# Patient Record
Sex: Female | Born: 1955 | Race: White | Hispanic: No | Smoking: Current every day smoker
Health system: Southern US, Community
[De-identification: ages and names within clinical notes are randomized; demographics above are authoritative.]

## PROBLEM LIST (undated history)

## (undated) DIAGNOSIS — E041 Nontoxic single thyroid nodule: Secondary | ICD-10-CM

## (undated) DIAGNOSIS — D649 Anemia, unspecified: Secondary | ICD-10-CM

## (undated) DIAGNOSIS — J449 Chronic obstructive pulmonary disease, unspecified: Secondary | ICD-10-CM

## (undated) DIAGNOSIS — F17200 Nicotine dependence, unspecified, uncomplicated: Secondary | ICD-10-CM

## (undated) DIAGNOSIS — J189 Pneumonia, unspecified organism: Secondary | ICD-10-CM

## (undated) DIAGNOSIS — R0602 Shortness of breath: Secondary | ICD-10-CM

## (undated) DIAGNOSIS — F418 Other specified anxiety disorders: Secondary | ICD-10-CM

## (undated) DIAGNOSIS — J948 Other specified pleural conditions: Secondary | ICD-10-CM

## (undated) DIAGNOSIS — J90 Pleural effusion, not elsewhere classified: Secondary | ICD-10-CM

## (undated) DIAGNOSIS — R042 Hemoptysis: Secondary | ICD-10-CM

## (undated) DIAGNOSIS — J869 Pyothorax without fistula: Secondary | ICD-10-CM

## (undated) DIAGNOSIS — K219 Gastro-esophageal reflux disease without esophagitis: Secondary | ICD-10-CM

## (undated) DIAGNOSIS — R634 Abnormal weight loss: Secondary | ICD-10-CM

## (undated) DIAGNOSIS — Z8709 Personal history of other diseases of the respiratory system: Secondary | ICD-10-CM

## (undated) DIAGNOSIS — C3490 Malignant neoplasm of unspecified part of unspecified bronchus or lung: Secondary | ICD-10-CM

## (undated) DIAGNOSIS — J3800 Paralysis of vocal cords and larynx, unspecified: Secondary | ICD-10-CM

## (undated) HISTORY — PX: FOREARM FRACTURE SURGERY: SHX649

## (undated) HISTORY — DX: Abnormal weight loss: R63.4

## (undated) HISTORY — DX: Other specified anxiety disorders: F41.8

## (undated) HISTORY — DX: Pyothorax without fistula: J86.9

## (undated) HISTORY — DX: Other specified pleural conditions: J94.8

## (undated) HISTORY — DX: Chronic obstructive pulmonary disease, unspecified: J44.9

## (undated) HISTORY — DX: Hemoptysis: R04.2

## (undated) HISTORY — DX: Paralysis of vocal cords and larynx, unspecified: J38.00

## (undated) HISTORY — PX: PERIPHERALLY INSERTED CENTRAL CATHETER INSERTION: SHX2221

## (undated) HISTORY — DX: Pleural effusion, not elsewhere classified: J90

## (undated) HISTORY — DX: Anemia, unspecified: D64.9

## (undated) HISTORY — DX: Malignant neoplasm of unspecified part of unspecified bronchus or lung: C34.90

## (undated) HISTORY — DX: Nicotine dependence, unspecified, uncomplicated: F17.200

## (undated) HISTORY — DX: Nontoxic single thyroid nodule: E04.1

## (undated) HISTORY — DX: Pneumonia, unspecified organism: J18.9

## (undated) HISTORY — PX: THORACOTOMY: SUR1349

## (undated) HISTORY — DX: Gastro-esophageal reflux disease without esophagitis: K21.9

## (undated) HISTORY — DX: Personal history of other diseases of the respiratory system: Z87.09

---

## 1977-05-16 HISTORY — PX: EYE SURGERY: SHX253

## 1999-05-17 HISTORY — PX: LUNG LOBECTOMY: SHX167

## 2012-04-09 ENCOUNTER — Other Ambulatory Visit: Payer: Self-pay | Admitting: Internal Medicine

## 2012-04-09 DIAGNOSIS — C3490 Malignant neoplasm of unspecified part of unspecified bronchus or lung: Secondary | ICD-10-CM

## 2012-04-19 ENCOUNTER — Encounter: Payer: Self-pay | Admitting: *Deleted

## 2012-04-19 ENCOUNTER — Institutional Professional Consult (permissible substitution) (INDEPENDENT_AMBULATORY_CARE_PROVIDER_SITE_OTHER): Payer: Medicaid Other | Admitting: Cardiothoracic Surgery

## 2012-04-19 ENCOUNTER — Other Ambulatory Visit: Payer: Self-pay | Admitting: *Deleted

## 2012-04-19 ENCOUNTER — Other Ambulatory Visit: Payer: Self-pay | Admitting: Internal Medicine

## 2012-04-19 ENCOUNTER — Encounter (HOSPITAL_COMMUNITY)
Admission: RE | Admit: 2012-04-19 | Discharge: 2012-04-19 | Disposition: A | Discharge status: Home / Self Care | Payer: Medicaid Other | Source: Ambulatory Visit | Attending: Internal Medicine | Admitting: Internal Medicine

## 2012-04-19 VITALS — BP 120/80 | HR 88 | Temp 98.0°F | Resp 20 | Ht 65.0 in | Wt 94.0 lb

## 2012-04-19 DIAGNOSIS — K219 Gastro-esophageal reflux disease without esophagitis: Secondary | ICD-10-CM | POA: Insufficient documentation

## 2012-04-19 DIAGNOSIS — J449 Chronic obstructive pulmonary disease, unspecified: Secondary | ICD-10-CM | POA: Insufficient documentation

## 2012-04-19 DIAGNOSIS — C3490 Malignant neoplasm of unspecified part of unspecified bronchus or lung: Secondary | ICD-10-CM

## 2012-04-19 DIAGNOSIS — J3800 Paralysis of vocal cords and larynx, unspecified: Secondary | ICD-10-CM | POA: Insufficient documentation

## 2012-04-19 DIAGNOSIS — F17200 Nicotine dependence, unspecified, uncomplicated: Secondary | ICD-10-CM | POA: Insufficient documentation

## 2012-04-19 DIAGNOSIS — R222 Localized swelling, mass and lump, trunk: Secondary | ICD-10-CM

## 2012-04-19 DIAGNOSIS — R599 Enlarged lymph nodes, unspecified: Secondary | ICD-10-CM | POA: Insufficient documentation

## 2012-04-19 DIAGNOSIS — Z85118 Personal history of other malignant neoplasm of bronchus and lung: Secondary | ICD-10-CM

## 2012-04-19 DIAGNOSIS — Z8709 Personal history of other diseases of the respiratory system: Secondary | ICD-10-CM | POA: Insufficient documentation

## 2012-04-19 DIAGNOSIS — D381 Neoplasm of uncertain behavior of trachea, bronchus and lung: Secondary | ICD-10-CM

## 2012-04-19 DIAGNOSIS — R634 Abnormal weight loss: Secondary | ICD-10-CM | POA: Insufficient documentation

## 2012-04-19 DIAGNOSIS — F418 Other specified anxiety disorders: Secondary | ICD-10-CM | POA: Insufficient documentation

## 2012-04-19 LAB — GLUCOSE, CAPILLARY: Glucose-Capillary: 89 mg/dL (ref 70–99)

## 2012-04-19 MED ORDER — FLUDEOXYGLUCOSE F - 18 (FDG) INJECTION
14.1000 | Freq: Once | INTRAVENOUS | Status: AC | PRN
  Administered 2012-04-19: 14.1 via INTRAVENOUS

## 2012-04-19 NOTE — Progress Notes (Signed)
Spoke with pt and husband at [facility] today.  Educational/resource information given on smoking cessation

## 2012-04-23 ENCOUNTER — Ambulatory Visit (HOSPITAL_COMMUNITY)
Admission: RE | Admit: 2012-04-23 | Discharge: 2012-04-23 | Disposition: A | Discharge status: Home / Self Care | Payer: Medicaid Other | Source: Ambulatory Visit | Attending: Cardiothoracic Surgery | Admitting: Cardiothoracic Surgery

## 2012-04-23 DIAGNOSIS — Z85118 Personal history of other malignant neoplasm of bronchus and lung: Secondary | ICD-10-CM | POA: Insufficient documentation

## 2012-04-23 DIAGNOSIS — R0609 Other forms of dyspnea: Secondary | ICD-10-CM | POA: Insufficient documentation

## 2012-04-23 DIAGNOSIS — R222 Localized swelling, mass and lump, trunk: Secondary | ICD-10-CM | POA: Insufficient documentation

## 2012-04-23 DIAGNOSIS — R059 Cough, unspecified: Secondary | ICD-10-CM | POA: Insufficient documentation

## 2012-04-23 DIAGNOSIS — R05 Cough: Secondary | ICD-10-CM | POA: Insufficient documentation

## 2012-04-23 DIAGNOSIS — F17200 Nicotine dependence, unspecified, uncomplicated: Secondary | ICD-10-CM | POA: Insufficient documentation

## 2012-04-23 DIAGNOSIS — D381 Neoplasm of uncertain behavior of trachea, bronchus and lung: Secondary | ICD-10-CM

## 2012-04-23 DIAGNOSIS — R0989 Other specified symptoms and signs involving the circulatory and respiratory systems: Secondary | ICD-10-CM | POA: Insufficient documentation

## 2012-04-23 DIAGNOSIS — J988 Other specified respiratory disorders: Secondary | ICD-10-CM | POA: Insufficient documentation

## 2012-04-23 LAB — PULMONARY FUNCTION TEST

## 2012-04-23 MED ORDER — ALBUTEROL SULFATE (5 MG/ML) 0.5% IN NEBU
2.5000 mg | INHALATION_SOLUTION | Freq: Once | RESPIRATORY_TRACT | Status: AC
  Administered 2012-04-23: 2.5 mg via RESPIRATORY_TRACT

## 2012-04-24 ENCOUNTER — Telehealth: Payer: Self-pay | Admitting: *Deleted

## 2012-04-24 NOTE — Telephone Encounter (Signed)
Called pt with phone number for free nicotine patches to quit smoking.

## 2012-04-26 ENCOUNTER — Encounter: Payer: Self-pay | Admitting: Cardiothoracic Surgery

## 2012-04-26 ENCOUNTER — Ambulatory Visit (INDEPENDENT_AMBULATORY_CARE_PROVIDER_SITE_OTHER): Payer: Medicaid Other | Admitting: Cardiothoracic Surgery

## 2012-04-26 VITALS — BP 112/77 | HR 79 | Resp 18 | Ht 65.0 in | Wt 94.0 lb

## 2012-04-26 DIAGNOSIS — R599 Enlarged lymph nodes, unspecified: Secondary | ICD-10-CM

## 2012-04-26 DIAGNOSIS — Z85118 Personal history of other malignant neoplasm of bronchus and lung: Secondary | ICD-10-CM

## 2012-04-26 DIAGNOSIS — D381 Neoplasm of uncertain behavior of trachea, bronchus and lung: Secondary | ICD-10-CM

## 2012-04-26 NOTE — Progress Notes (Signed)
[address]            [place] [address]          [phone]         Charvi Gammage [facility] Medical Record #[id number] Date of Birth: 04-29-1956  Referring: [name], MD Primary Care: [name], MD  Chief Complaint:    Chief Complaint  Patient presents with  . 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 For the past 6-7 months she's been treated in [facility] and [facility] for a right lung/chest process. 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 Over that time she's become progressively cachectic, unfortunately is still smoking. She is now referred for further opinion on treatment. It appears she was turned down for any surgical intervention in [facility].   Since seen last week, she feels better, less GI complaints on probiotic. Has decreased smoking but has continued.  Current Activity/ Functional Status: Patient is independent with mobility/ambulation, transfers, ADL's, IADL's.   Past Medical History  Diagnosis Date  . Depression with anxiety   . COPD (chronic obstructive pulmonary disease)   . Empyema   . GERD (gastroesophageal reflux disease)   . Lung cancer     RULobectomy 2001, stage I adenocarcinoma T2, N0, Mx    . H/O pulmonary fibrosis   . Tobacco use disorder   . Vocal cord paralysis   . Abnormal weight loss   . Pneumonia   . Pleural effusion   . Thyroid nodule     tiny on right   . Hydropneumothorax   . Normocytic anemia   . Hemoptysis     Past Surgical History  Procedure Date  . Lung lobectomy 2001    right upper lobe  . Thoracotomy     right side  . Right forearm surgery after trauma   . Eye surgery     No family history on  file.  History   Social History  . Marital Status: Divorced    Spouse Name: N/A    Number of Children: N/A  . Years of Education: N/A   Occupational History  . unemployed    Social History Main Topics  . Smoking status: Current Every Day Smoker  . Smokeless tobacco: Not on file  . Alcohol Use: No  . Drug Use: No  . Sexually Active: Not on file   Other Topics Concern  . Not on file   Social History Narrative    She lives alone, she is divorced,     History  Smoking status  . Current Every Day Smoker  Smokeless tobacco  . Not on file    History  Alcohol Use No     Allergies  Allergen Reactions  . Nsaids Anaphylaxis  . Azithromycin Nausea And Vomiting  . Codeine Other (See Comments)    Severe anxiety  . Levaquin (Levofloxacin In D5w) Other (See Comments)    unknown    Current Outpatient Prescriptions  Medication Sig Dispense Refill  . albuterol (PROVENTIL HFA;VENTOLIN HFA)  108 (90 BASE) MCG/ACT inhaler Inhale 2 puffs into the lungs every 6 (six) hours as needed.      . ALPRAZolam (XANAX) 1 MG tablet Take 1 mg by mouth 3 (three) times daily as needed.      . dicyclomine (BENTYL) 20 MG tablet Take 20 mg by mouth every 6 (six) hours.      . mirtazapine (REMERON) 15 MG tablet Take 15 mg by mouth at bedtime.      Marland Kitchen omeprazole (PRILOSEC) 40 MG capsule Take 40 mg by mouth daily.      . ondansetron (ZOFRAN) 4 MG tablet Take 4 mg by mouth every 8 (eight) hours as needed.           Review of Systems:     Cardiac Review of Systems: Y or N  Chest Pain [  n  ]  Resting SOB [ n  ] Exertional SOB  Cove.Etienne  ]  Orthopnea [ n ]   Pedal Edema [  n ]    Palpitations [ n ] Syncope  [n  ]   Presyncope [ n ]  General Review of Systems: [Y] = yes [  ]=no Constitional: recent weight change [ y]; anorexia [ y ]; fatigue [  y]; nausea Cove.Etienne ]; night sweats Cove.Etienne  ]; fever [ y ]; or chills Cove.Etienne  ];                                                                                                                                           Dental: poor dentition[ y ];   Eye : blurred vision [  ]; diplopia [   ]; vision changes [  ];  Amaurosis fugax[  ]; Resp: cough Cove.Etienne  ];  wheezing[ y ];  hemoptysis[ n ]; shortness of breath[ y ]; paroxysmal nocturnal dyspnea[ y]; dyspnea on exertion[  y]; or orthopnea[  ];  GI:  gallstones[  ], vomiting[y  ];  dysphagia[y  ]; melena[ n ];  hematochezia [  ]; heartburn[  ];   Hx of  Colonoscopy[n  ]; GU: kidney stones [  ]; hematuria[  ];   dysuria [  ];  nocturia[  ];  history of     obstruction [  ];             Skin: rash, swelling[  ];, hair loss[  ];  peripheral edema[  ];  or itching[  ]; Musculosketetal: myalgias[ y ];  joint swelling[  ];  joint erythema[  ];  joint pain[  ];  back pain[  ];  Heme/Lymph: bruising[  ];  bleeding[  ];  anemia[  ];  Neuro: TIA[  ];  headaches[  ];  stroke[ n ];  vertigo[  ];  seizures[  ];   paresthesias[  ];  difficulty walking[  ];  Psych:depression[y  ]; Kendell Bane  ];  Endocrine: diabetes[  ];  thyroid dysfunction[  ];  Immunizations: Flu [ n ]; Pneumococcal[n  ];  Other:  Physical Exam: BP 112/77  Pulse 79  Resp 18  Ht 5\' 5"  (1.651 m)  Wt 94 lb (42.638 kg)  BMI 15.64 kg/m2  SpO2 91%  General appearance: alert, cooperative, appears older than stated age, cachectic, fatigued and mild distress Neurologic: intact Heart: regular rate and rhythm, S1, S2 normal, no murmur, click, rub or gallop and normal apical impulse Lungs: bronchophony LLL and LUL and diminished breath sounds RLL, RML and RUL Abdomen: soft, non-tender; bowel sounds normal; no masses,  no organomegaly Extremities: extremities normal, atraumatic, no cyanosis or edema and Homans sign is negative, no sign of DVT Wound: healed rt chest scar from thoracotomy No palpable cervical or supraclavicular adenopathy  Diagnostic Studies & Laboratory data:     Recent Radiology Findings:  Nm Pet Image Restag (ps) Skull Base To Thigh  04/19/2012   *RADIOLOGY REPORT*  Clinical Data: Subsequent treatment strategy for restaging of lung cancer.  NUCLEAR MEDICINE PET SKULL BASE TO THIGH  Fasting Blood Glucose:  89  Technique:  14.1 mCi F-18 FDG was injected intravenously. CT data was obtained and used for attenuation correction and anatomic localization only.  (This was not acquired as a diagnostic CT examination.) Additional exam technical data entered on technologist worksheet.  Comparison:  Chest CT of 07/14/2011.  Findings:  Neck: Motion degradation between the PET and CT images.  A focus of hypermetabolism which is felt to correspond to an interpolar right-sided thyroid nodule.  This measures 1.0 cm and a S.U.V. max of 4.2 on image 57/series 2.  Chest:  Although  suboptimally evaluated on these non dedicated axial only CT images, a cavitary process involving the superior right hemithorax is favored to represent a loculated hydropneumothorax, secondary to bronchopleural fistula.  There is nonspecific circumferential hypermetabolism within the right pleural space.  Decreased aerationwithin the inferior right lung, with diffuse pulmonary opacification and foci of extra alveolar air.  This area is relatively diffusely hypermetabolic, and measures a S.U.V. max of 11.3, including on image 105/series 2.  A more inferior area of right-sided pleural based hypermetabolism is at the site of pleural fluid or thickening.  This measures a S.U.V. max of 8.4 on approximately image 118/series 2.  A right paratracheal node measures 1.6 cm and a S.U.V. max of 5.5 on image 79/series 2.  This node is decreased from 2.3 cm on the prior exam.  Separate area of apparent right paratracheal hypermetabolism is secondary to Port-A-Cath injection site.  Abdomen/Pelvis:  Eccentric right posterior pelvic focus of hypermetabolism measures a S.U.V. max of 10.9, including on approximately image 215/series 2.  This projects posterior to the bladder on CT.  No definite uterine mass is seen.   There is a suggestion of fluid density with thin septate involving the right hemi pelvis on image 214/series 2.  Skeleton:  Mild hypermetabolism involving the posterior lateral right ninth rib.  Evaluation of this area is motion degraded on CT. There is no definite underlying lesion identified.  CT  images performed for attenuation correction demonstrate no further findings within the neck. Volume loss in the right hemithorax.  Severe centrilobular emphysema.  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  Right-sided pleural fluid/thickening, small in volume.  Mild osteopenia.  Probable bone island the right acetabulum.  Postsurgical or traumatic defects of high right ribs. Probable remote right clavicular trauma as  well.  IMPRESSION:  1.  Complex appearance of the right hemithorax.  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  High circumferential pleural hypermetabolism is indeterminate and could be inflammatory.  A lower right pleural focus of hypermetabolism is somewhat more focal and pleural metastatic disease cannot be excluded. Dedicated repeat contrast enhanced CT may be informative. 2.  Decreased aeration of the remaining right lung.  Near complete pulmonary opacification with areas of necrosis / extra alveolar air and hypermetabolism.  Findings all could be infectious or related to prior radiation therapy.  Underlying neoplastic process is difficult to exclude. 3.  Suspicious right paratracheal hypermetabolic node; decreased in size since 07/14/2011. 4.  No extra osseous extrathoracic disease identified. 5.  T9 focus of hypermetabolism which is not well evaluated at CT. Favored to be post-traumatic.  Recommend attention on follow-up. 6.  Hypermetabolic right thyroid nodule.  Consider further characterization with ultrasound. 7.  Hypermetabolism in the right paracentral pelvis.   Concurrent suggestion of a cystic process in this region.  Potential etiologies include hypermetabolism from a right hydrosalpinx or ovarian lesion versus an otherwise occult uterine process. Alternatively, this hypermetabolism could be physiologic and due to misregistration of the adjacent bladder.  Pelvic ultrasound should be considered.   Original Report Authenticated By: Jeronimo Greaves, M.D.     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 The CT scans in August compared to nail suggest complete destruction of the remaining lung. The patient is having difficulty taking any nutrition. She continues on IV antibiotics. I reviewed a significant number of records taking more than 2 hours and have discussed on the phone the case with Dr. Oneida Alar from [facility] had previously been following her. It appears she needs a completion pneumonectomy, and with the infected pleural space and L. lower cervical flap with chronic drainage of the right chest cavity.  I have recommend to her we proceed with bronchoscopy as first evaluation of developing a treatment plan she can survive. She is agreeable  Delight Ovens MD  Beeper [phone] Office [phone]

## 2012-04-26 NOTE — Patient Instructions (Signed)
No smoking Flexible Bronchoscopy Bronchoscopy is a procedure for diagnosing problems in the lungs. It allows your caregiver to examine the passageways in the lungs by direct exam. This is accomplished by the use of a flexible telescope-like tool (flexible fiberoptic bronchoscope), which is passed down to the air passageways to be examined.  LET YOUR CAREGIVER KNOW ABOUT:   Allergies to food or medicine.  Medicines taken, including vitamins, herbs, eyedrops, over-the-counter medicines, and creams.  Use of steroids (by mouth or creams).  Previous problems with anesthetics or numbing medicines.  History of bleeding problems or blood clots.  Previous surgery.  Other health problems, including diabetes and kidney problems.  Possibility of pregnancy, if this applies. BEFORE THE PROCEDURE   Do not eat or drink anything 8 hours before the test.  Medications may be given to relax you, dry up your secretions, and to control coughing. The medication which dries up secretions will make your mouth very dry.  Local anesthetics (medications) are given to numb your mouth, nose, throat and voice box (larynx). PROCEDURE   Relax as much as possible during the procedure.  Follow the caregiver's instructions to speed up the procedure.  Your breathing is not blocked (obstructed) during this procedure. You will be able to breath normally during the procedure.  If tissue samples (biopsies) are needed in the outer portions of the lung, sometimes a special type of X-ray (fluoroscopy) is required.  Abnormal areas will be brushed or biopsied for exam under a microscope.  If any bleeding occurs from these areas, a drug may be used to make the blood vessels smaller (constrict) to stop or decrease the bleeding.  You may receive a chest X-ray following the procedure to make sure the lungs have not collapsed (pneumothorax). AFTER THE PROCEDURE   You may resume normal activities.  Call the caregiver or  return for an appointment as directed if biopsies were taken.  Do not eat or drink anything until cough and gag reflexes have returned. There is danger of burning yourself or getting food or water into your lungs when your mouth and airways are numb. After the numbness is gone, you may begin taking a normal diet. SEEK IMMEDIATE MEDICAL CARE IF:   You become lightheaded.  You get short of breath.  You become faint.  You develop chest pain.  You cough up blood. Document Released: 04/29/2000 Document Revised: 07/25/2011 Document Reviewed: 01/19/2011 [facility] Patient Information 2013 [place], [place].  Flexible Bronchoscopy Care After These instructions give you information on caring for yourself after your procedure. Your doctor may also give you specific instructions. Call your doctor if you have any problems or questions after your procedure. HOME CARE  Do not eat or drink anything for 2 hours after your test.  After 2 hours have passed, eat soft food and drink liquids slowly.  The day after the test, go back to eating as normal.  Continue normal activities.  Keep all doctor visits if tissue samples (biopsies) were taken. Finding out the results of your test Ask when your test results will be ready. Make sure you get your test results. GET HELP RIGHT AWAY IF:  You get lightheaded.  You get short of breath.  You feel like you are going to pass out (faint).  You have chest pain.  You cough up blood. MAKE SURE YOU:  Understand these instructions.  Will watch your condition.  Will get help right away if you are not doing well or get worse. Document Released:  02/27/2009 Document Revised: 07/25/2011 Document Reviewed: 02/27/2009 [facility] Patient Information 2013 [place], [place].

## 2012-04-26 NOTE — Progress Notes (Signed)
[address]            [place] [address]          [phone]      [name] [name] [facility] Medical Record #[id number] Date of Birth: July 10, 1955  Referring: [name], MD Primary Care: [name], MD  Chief Complaint:    Chief Complaint  Patient presents with  . Adenopathy    MTOC/ Referral from Dr Reche Dixon for surgical eval on enlarged lymph nodes with a Hx of lung cancer, PET Scan 04/19/12    History of Present Illness:    Patient is a 56 year old female who underwent right upper lobectomy for carcinoma the lung approximately 12 years ago at another institution. For the past 6-7 months she's been treated in [facility] and [facility] for a right lung/chest process. 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 Over that time she's become progressively cachectic, unfortunately is still smoking. She is now referred for further opinion on treatment. It appears she was turned down for any surgical intervention in [facility].     Current Activity/ Functional Status: Patient is independent with mobility/ambulation, transfers, ADL's, IADL's.   Past Medical History  Diagnosis Date  . Depression with anxiety   . COPD (chronic obstructive pulmonary disease)   . Empyema   . GERD (gastroesophageal reflux disease)   . Lung cancer     RULobectomy 2001, stage I adenocarcinoma T2, N0, Mx    . H/O pulmonary fibrosis   . Tobacco use disorder   . Vocal cord paralysis   . Abnormal weight loss   . Pneumonia   . Pleural effusion   . Thyroid nodule     tiny on right   . Hydropneumothorax   . Normocytic anemia   . Hemoptysis     Past Surgical History  Procedure Date  . Lung lobectomy 2001    right upper lobe  . Thoracotomy     right side  . Right forearm surgery after trauma   . Eye surgery     No family history on file.  History   Social History  .  Marital Status: Divorced    Spouse Name: N/A    Number of Children: N/A  . Years of Education: N/A   Occupational History  . unemployed    Social History Main Topics  . Smoking status: Current Every Day Smoker  . Smokeless tobacco: Not on file  . Alcohol Use: No  . Drug Use: No  . Sexually Active: Not on file   Other Topics Concern  . Not on file   Social History Narrative    She lives alone, she is divorced,     History  Smoking status  . Current Every Day Smoker  Smokeless tobacco  . Not on file    History  Alcohol Use No     Allergies  Allergen Reactions  . Nsaids Anaphylaxis  . Azithromycin Nausea And Vomiting  . Codeine Other (See Comments)    Severe anxiety  . Levaquin (Levofloxacin In D5w) Other (See Comments)    unknown    Current Outpatient Prescriptions  Medication Sig Dispense Refill  . albuterol (PROVENTIL HFA;VENTOLIN HFA) 108 (90 BASE) MCG/ACT inhaler Inhale 2 puffs into the lungs every 6 (six) hours as needed.      Marland Kitchen  ALPRAZolam (XANAX) 1 MG tablet Take 1 mg by mouth 3 (three) times daily as needed.      . dicyclomine (BENTYL) 20 MG tablet Take 20 mg by mouth every 6 (six) hours.      . mirtazapine (REMERON) 15 MG tablet Take 15 mg by mouth at bedtime.      Marland Kitchen omeprazole (PRILOSEC) 40 MG capsule Take 40 mg by mouth daily.      . ondansetron (ZOFRAN) 4 MG tablet Take 4 mg by mouth every 8 (eight) hours as needed.           Review of Systems:     Cardiac Review of Systems: Y or N  Chest Pain [  n  ]  Resting SOB [ n  ] Exertional SOB  Cove.Etienne  ]  Orthopnea [ n ]   Pedal Edema [  n ]    Palpitations [ n ] Syncope  [n  ]   Presyncope [ n ]  General Review of Systems: [Y] = yes [  ]=no Constitional: recent weight change [ y]; anorexia [ y ]; fatigue [  y]; nausea Cove.Etienne ]; night sweats Cove.Etienne  ]; fever [ y ]; or chills Cove.Etienne  ];                                                                                                                                            Dental: poor dentition[ y ];   Eye : blurred vision [  ]; diplopia [   ]; vision changes [  ];  Amaurosis fugax[  ]; Resp: cough Cove.Etienne  ];  wheezing[ y ];  hemoptysis[ n ]; shortness of breath[ y ]; paroxysmal nocturnal dyspnea[ y]; dyspnea on exertion[  y]; or orthopnea[  ];  GI:  gallstones[  ], vomiting[y  ];  dysphagia[y  ]; melena[ n ];  hematochezia [  ]; heartburn[  ];   Hx of  Colonoscopy[n  ]; GU: kidney stones [  ]; hematuria[  ];   dysuria [  ];  nocturia[  ];  history of     obstruction [  ];             Skin: rash, swelling[  ];, hair loss[  ];  peripheral edema[  ];  or itching[  ]; Musculosketetal: myalgias[ y ];  joint swelling[  ];  joint erythema[  ];  joint pain[  ];  back pain[  ];  Heme/Lymph: bruising[  ];  bleeding[  ];  anemia[  ];  Neuro: TIA[  ];  headaches[  ];  stroke[ n ];  vertigo[  ];  seizures[  ];   paresthesias[  ];  difficulty walking[  ];  Psych:depression[y  ]; Kendell Bane  ];  Endocrine: diabetes[  ];  thyroid dysfunction[  ];  Immunizations: Flu [ n ]; Pneumococcal[n  ];  Other:  Physical Exam: BP 120/80  Pulse 88  Temp 98 F (36.7 C) (Oral)  Resp 20  Ht 5\' 5"  (1.651 m)  Wt 94 lb (42.638 kg)  BMI 15.64 kg/m2  SpO2 90%  General appearance: alert, cooperative, appears older than stated age, cachectic, fatigued and mild distress Neurologic: intact Heart: regular rate and rhythm, S1, S2 normal, no murmur, click, rub or gallop and normal apical impulse Lungs: bronchophony LLL and LUL and diminished breath sounds RLL, RML and RUL Abdomen: soft, non-tender; bowel sounds normal; no masses,  no organomegaly Extremities: extremities normal, atraumatic, no cyanosis or edema and Homans sign is negative, no sign of DVT Wound: healed rt chest scar from thoracotomy No palpable cervical or supraclavicular adenopathy  Diagnostic Studies & Laboratory data:     Recent Radiology Findings:  Nm Pet Image Restag (ps) Skull Base To Thigh  04/19/2012  *RADIOLOGY  REPORT*  Clinical Data: Subsequent treatment strategy for restaging of lung cancer.  NUCLEAR MEDICINE PET SKULL BASE TO THIGH  Fasting Blood Glucose:  89  Technique:  14.1 mCi F-18 FDG was injected intravenously. CT data was obtained and used for attenuation correction and anatomic localization only.  (This was not acquired as a diagnostic CT examination.) Additional exam technical data entered on technologist worksheet.  Comparison:  Chest CT of 07/14/2011.  Findings:  Neck: Motion degradation between the PET and CT images.  A focus of hypermetabolism which is felt to correspond to an interpolar right-sided thyroid nodule.  This measures 1.0 cm and a S.U.V. max of 4.2 on image 57/series 2.  Chest:  Although  suboptimally evaluated on these non dedicated axial only CT images, a cavitary process involving the superior right hemithorax is favored to represent a loculated hydropneumothorax, secondary to bronchopleural fistula.  There is nonspecific circumferential hypermetabolism within the right pleural space.  Decreased aerationwithin the inferior right lung, with diffuse pulmonary opacification and foci of extra alveolar air.  This area is relatively diffusely hypermetabolic, and measures a S.U.V. max of 11.3, including on image 105/series 2.  A more inferior area of right-sided pleural based hypermetabolism is at the site of pleural fluid or thickening.  This measures a S.U.V. max of 8.4 on approximately image 118/series 2.  A right paratracheal node measures 1.6 cm and a S.U.V. max of 5.5 on image 79/series 2.  This node is decreased from 2.3 cm on the prior exam.  Separate area of apparent right paratracheal hypermetabolism is secondary to Port-A-Cath injection site.  Abdomen/Pelvis:  Eccentric right posterior pelvic focus of hypermetabolism measures a S.U.V. max of 10.9, including on approximately image 215/series 2.  This projects posterior to the bladder on CT.  No definite uterine mass is seen.  There is a  suggestion of fluid density with thin septate involving the right hemi pelvis on image 214/series 2.  Skeleton:  Mild hypermetabolism involving the posterior lateral right ninth rib.  Evaluation of this area is motion degraded on CT. There is no definite underlying lesion identified.  CT  images performed for attenuation correction demonstrate no further findings within the neck. Volume loss in the right hemithorax.  Severe centrilobular emphysema.  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  Right-sided pleural fluid/thickening, small in volume.  Mild osteopenia.  Probable bone island the right acetabulum.  Postsurgical or traumatic defects of high right ribs. Probable remote right clavicular trauma as well.  IMPRESSION:  1.  Complex appearance of the right hemithorax.  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  High circumferential pleural hypermetabolism is indeterminate and could be inflammatory.  A lower right pleural focus of hypermetabolism is somewhat more focal and pleural metastatic disease cannot be excluded. Dedicated repeat contrast enhanced CT may be informative. 2.  Decreased aeration of the remaining right lung.  Near complete pulmonary opacification with areas of necrosis / extra alveolar air and hypermetabolism.  Findings all could be infectious or related to prior radiation therapy.  Underlying neoplastic process is difficult to exclude. 3.  Suspicious right paratracheal hypermetabolic node; decreased in size since 07/14/2011. 4.  No extra osseous extrathoracic disease identified. 5.  T9 focus of hypermetabolism which is not well evaluated at CT. Favored to be post-traumatic.  Recommend attention on follow-up. 6.  Hypermetabolic right thyroid nodule.  Consider further characterization with ultrasound. 7.  Hypermetabolism in the right paracentral pelvis.  Concurrent  suggestion of a cystic process in this region.  Potential etiologies include hypermetabolism from a right hydrosalpinx or ovarian lesion versus an otherwise occult uterine process. Alternatively, this hypermetabolism could be physiologic and due to misregistration of the adjacent bladder.  Pelvic ultrasound should be considered.   Original Report Authenticated By: Jeronimo Greaves, M.D.     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 The CT scans in August compared to nail suggest complete destruction of the remaining lung. The patient is having difficulty taking any nutrition. She continues on IV antibiotics. I reviewed a significant number of records taking more than 2 hours and have discussed on the phone the case with Dr. Oneida Alar from [facility] had previously been following her. It appears she needs a completion pneumonectomy, and with the infected pleural space and L. lower cervical flap with chronic drainage of the right chest cavity. I plan to see the patient back in one week strongly encouraged her to stop smoking, will obtain a new set of pulmonary function studies, and further review her extensive medical record. With the patient's long-term antibiotics and bowel complaints we've suggested to her using a probiotic over-the-counter, to absolutely restrain from smoking, and have made some arrangements for her to obtain nicotine patches to help in this endeavor.  I spent over an hour face-to-face time with the patient and her husband and additional 2 hours and reveal her extensive medical records from [facility] and from [place].  We'll plan to see her back next week with followup pulmonary function studies ,  and likely will recommend bronchoscopy before making any further definite treatment plans . Delight Ovens MD  Beeper [phone] Office [phone]

## 2012-04-27 ENCOUNTER — Other Ambulatory Visit: Payer: Self-pay | Admitting: *Deleted

## 2012-04-27 ENCOUNTER — Encounter (HOSPITAL_COMMUNITY): Payer: Self-pay | Admitting: Pharmacy Technician

## 2012-04-27 DIAGNOSIS — R911 Solitary pulmonary nodule: Secondary | ICD-10-CM

## 2012-04-30 ENCOUNTER — Inpatient Hospital Stay (HOSPITAL_COMMUNITY): Admission: RE | Admit: 2012-04-30 | Discharge: 2012-04-30 | Payer: Medicaid Other | Source: Ambulatory Visit

## 2012-04-30 ENCOUNTER — Encounter (HOSPITAL_COMMUNITY): Payer: Self-pay | Admitting: *Deleted

## 2012-04-30 NOTE — Pre-Procedure Instructions (Signed)
20 [name] [name]  04/30/2012   Your procedure is scheduled on:  Tuesday, December 17th.  Report to [facility] at 7:30AM.  Call this number if you have problems the morning of surgery: [phone]   Remember: Nothing to eat or drink after Midnight.     Take these medicines the morning of surgery with A SIP OF WATER:  Omeprazole (Prilosec) , use Albuterol Inhaler and bring to the hospital with you.  May take Alprazolam (Xanax) if needed.   Do not wear jewelry, make-up or nail polish.  Do not wear lotions, powders, or perfumes. You may wear deodorant.  Do not shave 48 hours prior to surgery. Men may shave face and neck.  Do not bring valuables to the hospital.  Contacts, dentures or bridgework may not be worn into surgery.  Leave suitcase in the car. After surgery it may be brought to your room.  For patients admitted to the hospital, checkout time is 11:00 AM the day of discharge.   Patients discharged the day of surgery will not be allowed to drive home.  Name and phone number of your driver: __________________   Special Instructions: Shower with CHG wash (Bactoshield) tonight and again in the am prior to arriving to hospital.   Please read over the following fact sheets that you were given: Pain Booklet, Coughing and Deep Breathing and Surgical Site Infection Prevention

## 2012-04-30 NOTE — Progress Notes (Signed)
I notified Darius Bump with [facility] that Mrs Lerner could not come in for PAT.  Alycia Rossetti said she would Notify Dr Tyrone Sage. Pt to arrive at 0730 to [facility] tomorrow.

## 2012-04-30 NOTE — Progress Notes (Signed)
Pt did not come for PAT.  I called patients home and patient asked me to talk to her husband.  Mr Garza said that paat was coming for PAT visit and portable oxygen tank would not fill.  Mr Burger said that the had been on th e phone with  [facility]. [facility] said patient had to come to the store to get another machine, pt is unable to go there without oxygen.  I instructed patients husband to call her medical md.  I will call patient back later.

## 2012-05-01 ENCOUNTER — Ambulatory Visit (HOSPITAL_COMMUNITY): Payer: Medicaid Other | Admitting: Anesthesiology

## 2012-05-01 ENCOUNTER — Encounter (HOSPITAL_COMMUNITY): Payer: Self-pay | Admitting: *Deleted

## 2012-05-01 ENCOUNTER — Encounter (HOSPITAL_COMMUNITY)
Admission: RE | Discharge status: Against Medical Advice | Payer: Self-pay | Source: Ambulatory Visit | Attending: Cardiothoracic Surgery

## 2012-05-01 ENCOUNTER — Encounter (HOSPITAL_COMMUNITY): Payer: Self-pay | Admitting: Anesthesiology

## 2012-05-01 ENCOUNTER — Inpatient Hospital Stay (HOSPITAL_COMMUNITY)
Admission: RE | Admit: 2012-05-01 | Discharge: 2012-05-02 | DRG: 167 | Discharge status: Against Medical Advice | Payer: Medicaid Other | Source: Ambulatory Visit | Attending: Cardiothoracic Surgery | Admitting: Cardiothoracic Surgery

## 2012-05-01 ENCOUNTER — Emergency Department (HOSPITAL_COMMUNITY)
Admission: EM | Admit: 2012-05-01 | Discharge: 2012-05-01 | Disposition: A | Discharge status: Other Acute Inpatient Hospital | Payer: Medicaid Other

## 2012-05-01 ENCOUNTER — Ambulatory Visit (HOSPITAL_COMMUNITY): Payer: Medicaid Other

## 2012-05-01 DIAGNOSIS — F17200 Nicotine dependence, unspecified, uncomplicated: Secondary | ICD-10-CM | POA: Diagnosis present

## 2012-05-01 DIAGNOSIS — R634 Abnormal weight loss: Secondary | ICD-10-CM | POA: Diagnosis present

## 2012-05-01 DIAGNOSIS — R911 Solitary pulmonary nodule: Secondary | ICD-10-CM

## 2012-05-01 DIAGNOSIS — F341 Dysthymic disorder: Secondary | ICD-10-CM | POA: Diagnosis present

## 2012-05-01 DIAGNOSIS — J439 Emphysema, unspecified: Secondary | ICD-10-CM

## 2012-05-01 DIAGNOSIS — K219 Gastro-esophageal reflux disease without esophagitis: Secondary | ICD-10-CM | POA: Diagnosis present

## 2012-05-01 DIAGNOSIS — R64 Cachexia: Secondary | ICD-10-CM | POA: Diagnosis present

## 2012-05-01 DIAGNOSIS — J449 Chronic obstructive pulmonary disease, unspecified: Secondary | ICD-10-CM | POA: Diagnosis present

## 2012-05-01 DIAGNOSIS — Z681 Body mass index (BMI) 19 or less, adult: Secondary | ICD-10-CM

## 2012-05-01 DIAGNOSIS — F418 Other specified anxiety disorders: Secondary | ICD-10-CM | POA: Diagnosis present

## 2012-05-01 DIAGNOSIS — R627 Adult failure to thrive: Secondary | ICD-10-CM | POA: Diagnosis present

## 2012-05-01 DIAGNOSIS — J852 Abscess of lung without pneumonia: Principal | ICD-10-CM | POA: Diagnosis present

## 2012-05-01 DIAGNOSIS — J4489 Other specified chronic obstructive pulmonary disease: Secondary | ICD-10-CM | POA: Diagnosis present

## 2012-05-01 HISTORY — DX: Shortness of breath: R06.02

## 2012-05-01 HISTORY — PX: VIDEO BRONCHOSCOPY: SHX5072

## 2012-05-01 LAB — COMPREHENSIVE METABOLIC PANEL
ALT: 5 U/L (ref 0–35)
AST: 9 U/L (ref 0–37)
Albumin: 2.6 g/dL — ABNORMAL LOW (ref 3.5–5.2)
Alkaline Phosphatase: 83 U/L (ref 39–117)
BUN: 5 mg/dL — ABNORMAL LOW (ref 6–23)
CO2: 28 mEq/L (ref 19–32)
Calcium: 9.9 mg/dL (ref 8.4–10.5)
Chloride: 98 mEq/L (ref 96–112)
Creatinine, Ser: 0.45 mg/dL — ABNORMAL LOW (ref 0.50–1.10)
GFR calc Af Amer: >90 mL/min (ref >90)
GFR calc non Af Amer: >90 mL/min (ref >90)
Glucose, Bld: 95 mg/dL (ref 70–99)
Potassium: 4 mEq/L (ref 3.5–5.1)
Sodium: 136 mEq/L (ref 135–145)
Total Bilirubin: 0.2 mg/dL — ABNORMAL LOW (ref 0.3–1.2)
Total Protein: 8.8 g/dL — ABNORMAL HIGH (ref 6.0–8.3)

## 2012-05-01 LAB — CBC
HCT: 32.4 % — ABNORMAL LOW (ref 36.0–46.0)
Hemoglobin: 10.1 g/dL — ABNORMAL LOW (ref 12.0–15.0)
MCH: 28.6 pg (ref 26.0–34.0)
MCHC: 31.2 g/dL (ref 30.0–36.0)
MCV: 91.8 fL (ref 78.0–100.0)
Platelets: 376 10*3/uL (ref 150–400)
RBC: 3.53 MIL/uL — ABNORMAL LOW (ref 3.87–5.11)
RDW: 17.5 % — ABNORMAL HIGH (ref 11.5–15.5)
WBC: 11.5 10*3/uL — ABNORMAL HIGH (ref 4.0–10.5)

## 2012-05-01 LAB — PROTIME-INR
INR: 1.06 (ref 0.00–1.49)
Prothrombin Time: 13.7 seconds (ref 11.6–15.2)

## 2012-05-01 LAB — ALPHA-1-ANTITRYPSIN: A-1 Antitrypsin, Ser: 278 mg/dL — ABNORMAL HIGH (ref 90–200)

## 2012-05-01 LAB — APTT: aPTT: 33 seconds (ref 24–37)

## 2012-05-01 SURGERY — BRONCHOSCOPY, VIDEO-ASSISTED
Anesthesia: Monitor Anesthesia Care | Site: Chest | Wound class: Dirty or Infected

## 2012-05-01 MED ORDER — EPHEDRINE SULFATE 50 MG/ML IJ SOLN
INTRAMUSCULAR | Status: DC | PRN
  Administered 2012-05-01: 10 mg via INTRAVENOUS

## 2012-05-01 MED ORDER — MIDAZOLAM HCL 5 MG/5ML IJ SOLN
INTRAMUSCULAR | Status: DC | PRN
  Administered 2012-05-01: 2 mg via INTRAVENOUS

## 2012-05-01 MED ORDER — LACTATED RINGERS IV SOLN
INTRAVENOUS | Status: DC | PRN
  Administered 2012-05-01: 14:00:00 via INTRAVENOUS

## 2012-05-01 MED ORDER — PROPOFOL 10 MG/ML IV BOLUS
INTRAVENOUS | Status: DC | PRN
  Administered 2012-05-01: 100 mg via INTRAVENOUS
  Administered 2012-05-01 (×4): 10 mg via INTRAVENOUS

## 2012-05-01 MED ORDER — ALBUTEROL SULFATE (5 MG/ML) 0.5% IN NEBU
INHALATION_SOLUTION | RESPIRATORY_TRACT | Status: AC
  Administered 2012-05-01: 2.5 mg
  Filled 2012-05-01: qty 0.5

## 2012-05-01 MED ORDER — ONDANSETRON HCL 4 MG/2ML IJ SOLN
4.0000 mg | Freq: Once | INTRAMUSCULAR | Status: AC
  Administered 2012-05-01: 4 mg via INTRAVENOUS
  Filled 2012-05-01: qty 2

## 2012-05-01 MED ORDER — HYDROMORPHONE HCL PF 1 MG/ML IJ SOLN: 0.2500 mg | INTRAMUSCULAR | Status: DC | PRN

## 2012-05-01 MED ORDER — DEXTROSE-NACL 5-0.45 % IV SOLN
INTRAVENOUS | Status: DC
  Administered 2012-05-01: 17:00:00 via INTRAVENOUS

## 2012-05-01 MED ORDER — SENNA 8.6 MG PO TABS
1.0000 | ORAL_TABLET | Freq: Two times a day (BID) | ORAL | Status: DC
  Administered 2012-05-01: 8.6 mg via ORAL
  Filled 2012-05-01 (×3): qty 1

## 2012-05-01 MED ORDER — ONDANSETRON HCL 4 MG/2ML IJ SOLN: 4.0000 mg | Freq: Once | INTRAMUSCULAR | Status: DC | PRN

## 2012-05-01 MED ORDER — ALPRAZOLAM 1 MG PO TABS: 1.0000 mg | ORAL_TABLET | Freq: Three times a day (TID) | ORAL | Status: DC | PRN

## 2012-05-01 MED ORDER — NEOSTIGMINE METHYLSULFATE 1 MG/ML IJ SOLN
INTRAMUSCULAR | Status: DC | PRN
  Administered 2012-05-01: 3 mg via INTRAVENOUS

## 2012-05-01 MED ORDER — ACETAMINOPHEN 650 MG RE SUPP: 650.0000 mg | Freq: Four times a day (QID) | RECTAL | Status: DC | PRN

## 2012-05-01 MED ORDER — GLYCOPYRROLATE 0.2 MG/ML IJ SOLN
INTRAMUSCULAR | Status: DC | PRN
  Administered 2012-05-01: .5 mg via INTRAVENOUS

## 2012-05-01 MED ORDER — DOCUSATE SODIUM 100 MG PO CAPS
100.0000 mg | ORAL_CAPSULE | Freq: Two times a day (BID) | ORAL | Status: DC
  Administered 2012-05-01: 100 mg via ORAL
  Filled 2012-05-01: qty 1

## 2012-05-01 MED ORDER — VANCOMYCIN HCL IN DEXTROSE 1-5 GM/200ML-% IV SOLN
1000.0000 mg | INTRAVENOUS | Status: DC
  Filled 2012-05-01: qty 200

## 2012-05-01 MED ORDER — PANTOPRAZOLE SODIUM 40 MG PO TBEC
80.0000 mg | DELAYED_RELEASE_TABLET | Freq: Every day | ORAL | Status: DC
  Administered 2012-05-01: 80 mg via ORAL
  Filled 2012-05-01: qty 2

## 2012-05-01 MED ORDER — ONDANSETRON HCL 4 MG/2ML IJ SOLN
INTRAMUSCULAR | Status: AC
  Filled 2012-05-01: qty 2

## 2012-05-01 MED ORDER — ACETAMINOPHEN 325 MG PO TABS
650.0000 mg | ORAL_TABLET | Freq: Four times a day (QID) | ORAL | Status: DC | PRN
  Administered 2012-05-01: 650 mg via ORAL
  Filled 2012-05-01: qty 2

## 2012-05-01 MED ORDER — ROCURONIUM BROMIDE 100 MG/10ML IV SOLN
INTRAVENOUS | Status: DC | PRN
  Administered 2012-05-01: 30 mg via INTRAVENOUS

## 2012-05-01 MED ORDER — DIPHENHYDRAMINE HCL 25 MG PO CAPS
25.0000 mg | ORAL_CAPSULE | Freq: Every evening | ORAL | Status: DC | PRN
  Administered 2012-05-01: 25 mg via ORAL
  Filled 2012-05-01: qty 1

## 2012-05-01 MED ORDER — PHENYLEPHRINE HCL 10 MG/ML IJ SOLN
INTRAMUSCULAR | Status: DC | PRN
  Administered 2012-05-01: 80 ug via INTRAVENOUS
  Administered 2012-05-01: 160 ug via INTRAVENOUS
  Administered 2012-05-01 (×3): 80 ug via INTRAVENOUS

## 2012-05-01 MED ORDER — ACETAMINOPHEN 10 MG/ML IV SOLN: 1000.0000 mg | Freq: Once | INTRAVENOUS | Status: DC | PRN

## 2012-05-01 MED ORDER — LIDOCAINE HCL (PF) 1 % IJ SOLN
INTRAMUSCULAR | Status: AC
  Filled 2012-05-01: qty 30

## 2012-05-01 MED ORDER — ALPRAZOLAM 0.5 MG PO TABS
ORAL_TABLET | ORAL | Status: AC
  Filled 2012-05-01: qty 2

## 2012-05-01 MED ORDER — ALPRAZOLAM 0.5 MG PO TABS
1.0000 mg | ORAL_TABLET | Freq: Three times a day (TID) | ORAL | Status: DC | PRN
  Administered 2012-05-01 – 2012-05-02 (×2): 1 mg via ORAL
  Filled 2012-05-01: qty 2

## 2012-05-01 MED ORDER — ONDANSETRON HCL 4 MG/2ML IJ SOLN
INTRAMUSCULAR | Status: DC | PRN
  Administered 2012-05-01: 4 mg via INTRAVENOUS

## 2012-05-01 MED ORDER — ONDANSETRON HCL 4 MG PO TABS: 4.0000 mg | ORAL_TABLET | Freq: Four times a day (QID) | ORAL | Status: DC | PRN

## 2012-05-01 MED ORDER — ONDANSETRON HCL 4 MG/2ML IJ SOLN: 4.0000 mg | Freq: Four times a day (QID) | INTRAMUSCULAR | Status: DC | PRN

## 2012-05-01 MED ORDER — FENTANYL CITRATE 0.05 MG/ML IJ SOLN
INTRAMUSCULAR | Status: DC | PRN
  Administered 2012-05-01: 150 ug via INTRAVENOUS

## 2012-05-01 MED ORDER — LEVALBUTEROL HCL 0.63 MG/3ML IN NEBU
0.6300 mg | INHALATION_SOLUTION | Freq: Four times a day (QID) | RESPIRATORY_TRACT | Status: DC
  Filled 2012-05-01 (×7): qty 3

## 2012-05-01 MED ORDER — DICYCLOMINE HCL 20 MG PO TABS
20.0000 mg | ORAL_TABLET | Freq: Two times a day (BID) | ORAL | Status: DC
  Administered 2012-05-01: 20 mg via ORAL
  Filled 2012-05-01 (×3): qty 1

## 2012-05-01 MED ORDER — LACTATED RINGERS IV SOLN: INTRAVENOUS | Status: DC

## 2012-05-01 MED ORDER — SODIUM CHLORIDE 0.9 % IV SOLN
250.0000 mg | Freq: Three times a day (TID) | INTRAVENOUS | Status: DC
  Administered 2012-05-01 – 2012-05-02 (×2): 250 mg via INTRAVENOUS
  Filled 2012-05-01 (×4): qty 250

## 2012-05-01 SURGICAL SUPPLY — 19 items
BLOCK BITE 60FR ADLT L/F BLUE (MISCELLANEOUS) ×2 IMPLANT
BRUSH CYTOL CELLEBRITY 1.5X140 (MISCELLANEOUS) IMPLANT
CANISTER SUCTION 2500CC (MISCELLANEOUS) ×2 IMPLANT
CLOTH BEACON ORANGE TIMEOUT ST (SAFETY) ×2 IMPLANT
CONT SPEC 4OZ CLIKSEAL STRL BL (MISCELLANEOUS) ×2 IMPLANT
COTTONBALL LRG STERILE PKG (GAUZE/BANDAGES/DRESSINGS) ×2 IMPLANT
COVER TABLE BACK 60X90 (DRAPES) ×2 IMPLANT
FORCEPS BIOP RJ4 1.8 (CUTTING FORCEPS) IMPLANT
KIT ROOM TURNOVER OR (KITS) ×2 IMPLANT
MARKER SKIN DUAL TIP RULER LAB (MISCELLANEOUS) ×2 IMPLANT
NEEDLE BIOPSY TRANSBRONCH 21G (NEEDLE) IMPLANT
NS IRRIG 1000ML POUR BTL (IV SOLUTION) ×2 IMPLANT
OIL SILICONE PENTAX (PARTS (SERVICE/REPAIRS)) ×2 IMPLANT
SPONGE GAUZE 4X4 12PLY (GAUZE/BANDAGES/DRESSINGS) ×2 IMPLANT
SYR 20ML ECCENTRIC (SYRINGE) ×2 IMPLANT
TOWEL OR 17X24 6PK STRL BLUE (TOWEL DISPOSABLE) ×2 IMPLANT
TRAP SPECIMEN MUCOUS 40CC (MISCELLANEOUS) ×2 IMPLANT
TUBE CONNECTING 12X1/4 (SUCTIONS) ×2 IMPLANT
WATER STERILE IRR 1000ML POUR (IV SOLUTION) ×2 IMPLANT

## 2012-05-01 NOTE — Progress Notes (Signed)
ANTIBIOTIC CONSULT NOTE - INITIAL  Pharmacy Consult for vancomycin and primaxin Indication: suspected R lung abscess  Allergies  Allergen Reactions  . Codeine Other (See Comments)    Severe anxiety  . Other Other (See Comments)    Steroids  "feels like choking"    Patient Measurements: Height: 5\' 5"  (165.1 cm) Weight: 93 lb 7.6 oz (42.4 kg) IBW/kg (Calculated) : 57    Vital Signs: Temp: 98 F (36.7 C) (12/17 1755) Temp src: Oral (12/17 1755) BP: 108/71 mmHg (12/17 1755) Pulse Rate: 97  (12/17 1800) Intake/Output from previous day:   Intake/Output from this shift: Total I/O In: 1090 [P.O.:240; I.V.:850] Out: -   Labs:  Basename 05/01/12 0906  WBC 11.5*  HGB 10.1*  PLT 376  LABCREA --  CREATININE 0.45*   Estimated Creatinine Clearance: 53.8 ml/min (by C-G formula based on Cr of 0.45). No results found for this basename: VANCOTROUGH:2,VANCOPEAK:2,VANCORANDOM:2,GENTTROUGH:2,GENTPEAK:2,GENTRANDOM:2,TOBRATROUGH:2,TOBRAPEAK:2,TOBRARND:2,AMIKACINPEAK:2,AMIKACINTROU:2,AMIKACIN:2, in the last 72 hours   Microbiology: No results found for this or any previous visit (from the past 720 hour(s)).  Medical History: Past Medical History  Diagnosis Date  . Depression with anxiety   . COPD (chronic obstructive pulmonary disease)   . Empyema   . GERD (gastroesophageal reflux disease)   . Lung cancer     RULobectomy 2001, stage I adenocarcinoma T2, N0, Mx    . H/O pulmonary fibrosis   . Tobacco use disorder   . Vocal cord paralysis   . Abnormal weight loss   . Pneumonia   . Pleural effusion   . Thyroid nodule     tiny on right   . Hydropneumothorax   . Normocytic anemia   . Hemoptysis   . Shortness of breath     with exertion    Medications:  Prescriptions prior to admission  Medication Sig Dispense Refill  . albuterol (PROVENTIL HFA;VENTOLIN HFA) 108 (90 BASE) MCG/ACT inhaler Inhale 2 puffs into the lungs 2 (two) times daily as needed. For shortness of breath       . ALPRAZolam (XANAX) 1 MG tablet Take 1 mg by mouth 3 (three) times daily as needed. For anxiety      . dicyclomine (BENTYL) 20 MG tablet Take 20 mg by mouth 2 (two) times daily.       Marland Kitchen omeprazole (PRILOSEC) 40 MG capsule Take 40 mg by mouth 2 (two) times daily.        Assessment: 56 yo F s/p VATS for lung abscess 12/17.  S/p RUL lobectomy for Lung Ca ~12 years ago.  Noted to have been receiving abx via PICC over the last 6-7 months for R lung/chest process.  Noted to have FTT and advanced emyphysema, still actively smoking.  No a candidate for pneumonecty given advanced disease.  Spoke with May, PharmD at [facility] [phone].  She reports that patient receives Cefepime 2g IV q12h at home.  Patient states that she took her last dose yesterday morning. She didn't take the dose last night because, "it was going to give me a yeast infection."  Goal of Therapy:  Vancomycin trough level 15-20 mcg/ml  Plan:  - Vancomycin 1 g q24h - Primaxin 250 mg IV q8h - Follow up SCr, UOP, cultures, clinical course and adjust as clinically indicated.  Maronda Caison L. Illene Bolus, PharmD, BCPS Clinical Pharmacist Pager: [phone] Pharmacy: [phone] 05/01/2012 6:40 PM

## 2012-05-01 NOTE — Transfer of Care (Signed)
Immediate Anesthesia Transfer of Care Note  Patient: [name] [name]  Procedure(s) Performed: Procedure(s) (LRB) with comments: VIDEO BRONCHOSCOPY (N/A) - Video Bronchoscopy  Patient Location: PACU  Anesthesia Type:General  Level of Consciousness: awake, alert , patient cooperative and confused  Airway & Oxygen Therapy: Patient Spontanous Breathing and Patient connected to nasal cannula oxygen  Post-op Assessment: Report given to PACU RN  Post vital signs: Reviewed and stable  Complications: No apparent anesthesia complications

## 2012-05-01 NOTE — Progress Notes (Signed)
Contacted Dr. Cornelius Moras on call for Dr. Tyrone Sage regarding pt issues. Pt states she wants Tylenol Pm, received order for Benadryl. Pt states she would like a nicotine patch, no order received will be evaluated in morning by attending MD. Pt also is refusing CT scan tonight and would like to do it in the morning. MD made aware and is ok with this. Pt also complaining of high anxiety. MD aware as well and no changes to medications at this time. Will continue to monitor pt.

## 2012-05-01 NOTE — H&P (Signed)
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 For the past 6-7 months she's been treated in [facility] and [facility] for a right lung/chest process. 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 Over that time she's become progressively cachectic, unfortunately is still smoking. She is now referred for further opinion on treatment. It appears she was turned down for any surgical intervention in [facility].     Current Activity/ Functional Status: Patient is independent with mobility/ambulation, transfers, ADL's, IADL's.   Past Medical History  Diagnosis Date  . Depression with anxiety   . COPD (chronic obstructive pulmonary disease)   . Empyema   . GERD (gastroesophageal reflux disease)   . Lung cancer     RULobectomy 2001, stage I adenocarcinoma T2, N0, Mx    . H/O pulmonary fibrosis   . Tobacco use disorder   . Vocal cord paralysis   . Abnormal weight loss   . Pneumonia   . Pleural effusion   . Thyroid nodule     tiny on right   . Hydropneumothorax   . Normocytic anemia   . Hemoptysis   . Shortness of breath     with exertion    Past Surgical History  Procedure Date  . Lung lobectomy 2001    right upper lobe  . Thoracotomy     right side  . Right forearm surgery after trauma   . Peripherally inserted central catheter insertion   . Eye surgery     corrected "Cross eyes"    History reviewed. No pertinent family history.  History   Social History  . Marital  Status: Divorced    Spouse Name: N/A    Number of Children: N/A  . Years of Education: N/A   Occupational History  . unemployed    Social History Main Topics  . Smoking status: Current Every Day Smoker -- 1.0 packs/day for 38 years  . Smokeless tobacco: Not on file  . Alcohol Use: No  . Drug Use: No  . Sexually Active: Not on file   Other Topics Concern  . Not on file   Social History Narrative    She lives alone, she is divorced,     History  Smoking status  . Current Every Day Smoker -- 1.0 packs/day for 38 years  Smokeless tobacco  . Not on file    History  Alcohol Use No     Allergies  Allergen Reactions  . Codeine Other (See Comments)    Severe anxiety  . Other Other (See Comments)    Steroids  "feels like choking"    Current Facility-Administered Medications  Medication Dose Route Frequency Provider Last Rate Last Dose  . lactated ringers infusion  Intravenous Continuous Kipp Brood, MD           Review of Systems:     Cardiac Review of Systems: Y or N  Chest Pain [  n  ]  Resting SOB [ n  ] Exertional SOB  Cove.Etienne  ]  Orthopnea [ n ]   Pedal Edema [  n ]    Palpitations [ n ] Syncope  [n  ]   Presyncope [ n ]  General Review of Systems: [Y] = yes [  ]=no Constitional: recent weight change [ y]; anorexia [ y ]; fatigue [  y]; nausea Cove.Etienne ]; night sweats Cove.Etienne  ]; fever [ y ]; or chills Cove.Etienne  ];                                                                                                                                          Dental: poor dentition[ y ];   Eye : blurred vision [  ]; diplopia [   ]; vision changes [  ];  Amaurosis fugax[  ]; Resp: cough Cove.Etienne  ];  wheezing[ y ];  hemoptysis[ n ]; shortness of breath[ y ]; paroxysmal nocturnal dyspnea[ y]; dyspnea on exertion[  y]; or orthopnea[  ];  GI:  gallstones[  ], vomiting[y  ];  dysphagia[y  ]; melena[ n ];  hematochezia [  ]; heartburn[  ];   Hx of  Colonoscopy[n  ]; GU: kidney stones [  ]; hematuria[   ];   dysuria [  ];  nocturia[  ];  history of     obstruction [  ];             Skin: rash, swelling[  ];, hair loss[  ];  peripheral edema[  ];  or itching[  ]; Musculosketetal: myalgias[ y ];  joint swelling[  ];  joint erythema[  ];  joint pain[  ];  back pain[  ];  Heme/Lymph: bruising[  ];  bleeding[  ];  anemia[  ];  Neuro: TIA[  ];  headaches[  ];  stroke[ n ];  vertigo[  ];  seizures[  ];   paresthesias[  ];  difficulty walking[  ];  Psych:depression[y  ]; Kendell Bane  ];  Endocrine: diabetes[  ];  thyroid dysfunction[  ];  Immunizations: Flu [ n ]; Pneumococcal[n  ];  Other:  Physical Exam: BP 97/66  Pulse 86  Temp 97.7 F (36.5 C) (Oral)  Resp 18  SpO2 94%  General appearance: alert, cooperative, appears older than stated age, cachectic, fatigued and mild distress Neurologic: intact Heart: regular rate and rhythm, S1, S2 normal, no murmur, click, rub or gallop and normal apical impulse Lungs: bronchophony LLL and LUL and diminished breath sounds RLL, RML and RUL Abdomen: soft, non-tender; bowel sounds normal; no masses,  no organomegaly Extremities: extremities normal, atraumatic, no cyanosis or edema and Homans  sign is negative, no sign of DVT Wound: healed rt chest scar from thoracotomy No palpable cervical or supraclavicular adenopathy  Diagnostic Studies & Laboratory data:     Recent Radiology Findings:  Nm Pet Image Restag (ps) Skull Base To Thigh  04/19/2012  *RADIOLOGY REPORT*  Clinical Data: Subsequent treatment strategy for restaging of lung cancer.  NUCLEAR MEDICINE PET SKULL BASE TO THIGH  Fasting Blood Glucose:  89  Technique:  14.1 mCi F-18 FDG was injected intravenously. CT data was obtained and used for attenuation correction and anatomic localization only.  (This was not acquired as a diagnostic CT examination.) Additional exam technical data entered on technologist worksheet.  Comparison:  Chest CT of 07/14/2011.  Findings:  Neck: Motion degradation between  the PET and CT images.  A focus of hypermetabolism which is felt to correspond to an interpolar right-sided thyroid nodule.  This measures 1.0 cm and a S.U.V. max of 4.2 on image 57/series 2.  Chest:  Although  suboptimally evaluated on these non dedicated axial only CT images, a cavitary process involving the superior right hemithorax is favored to represent a loculated hydropneumothorax, secondary to bronchopleural fistula.  There is nonspecific circumferential hypermetabolism within the right pleural space.  Decreased aerationwithin the inferior right lung, with diffuse pulmonary opacification and foci of extra alveolar air.  This area is relatively diffusely hypermetabolic, and measures a S.U.V. max of 11.3, including on image 105/series 2.  A more inferior area of right-sided pleural based hypermetabolism is at the site of pleural fluid or thickening.  This measures a S.U.V. max of 8.4 on approximately image 118/series 2.  A right paratracheal node measures 1.6 cm and a S.U.V. max of 5.5 on image 79/series 2.  This node is decreased from 2.3 cm on the prior exam.  Separate area of apparent right paratracheal hypermetabolism is secondary to Port-A-Cath injection site.  Abdomen/Pelvis:  Eccentric right posterior pelvic focus of hypermetabolism measures a S.U.V. max of 10.9, including on approximately image 215/series 2.  This projects posterior to the bladder on CT.  No definite uterine mass is seen.  There is a suggestion of fluid density with thin septate involving the right hemi pelvis on image 214/series 2.  Skeleton:  Mild hypermetabolism involving the posterior lateral right ninth rib.  Evaluation of this area is motion degraded on CT. There is no definite underlying lesion identified.  CT  images performed for attenuation correction demonstrate no further findings within the neck. Volume loss in the right hemithorax.  Severe centrilobular emphysema.  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  Right-sided pleural fluid/thickening, small in volume.  Mild osteopenia.  Probable bone island the right acetabulum.  Postsurgical or traumatic defects of high right ribs. Probable remote right clavicular trauma as well.  IMPRESSION:  1.  Complex appearance of the right hemithorax.  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  High circumferential pleural hypermetabolism is indeterminate and could be inflammatory.  A lower right pleural focus of hypermetabolism is somewhat more focal and pleural metastatic disease cannot be excluded. Dedicated repeat contrast enhanced CT may be informative. 2.  Decreased aeration of the remaining right lung.  Near complete pulmonary opacification with areas of necrosis / extra alveolar air and hypermetabolism.  Findings all could be infectious or related to prior radiation therapy.  Underlying neoplastic process is difficult to exclude. 3.  Suspicious right paratracheal hypermetabolic node; decreased in size  since 07/14/2011. 4.  No extra osseous extrathoracic disease identified. 5.  T9 focus of hypermetabolism which is not well evaluated at CT. Favored to be post-traumatic.  Recommend attention on follow-up. 6.  Hypermetabolic right thyroid nodule.  Consider further characterization with ultrasound. 7.  Hypermetabolism in the right paracentral pelvis.  Concurrent suggestion of a cystic process in this region.  Potential etiologies include hypermetabolism from a right hydrosalpinx or ovarian lesion versus an otherwise occult uterine process. Alternatively, this hypermetabolism could be physiologic and due to misregistration of the adjacent bladder.  Pelvic ultrasound should be considered.   Original Report Authenticated By: Jeronimo Greaves, M.D.     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 The CT scans in August compared to nail suggest complete destruction of the remaining lung. The patient is having difficulty taking any nutrition. She continues on IV antibiotics. I reviewed a significant number of records taking more than 2 hours and have discussed on the phone the case with Dr. Oneida Alar from [facility] had previously been following her. It appears she needs a completion pneumonectomy, and with the infected pleural space and Elloser  flap with chronic drainage of the right chest cavity. Patient is brought to the OR today for bronchoscopy to asses old bronchial stump  The goals risks and alternatives of the planned surgical procedure Bronchoscopy  have been discussed with the patient in detail. The risks of the procedure including death, infection, stroke, myocardial infarction, bleeding, blood transfusion have all been discussed specifically.  I have quoted [name] a 1% of perioperative mortality and a complication rate as high as 10 %. The patient's questions have been answered.Oakley Orban is willing  to proceed with the planned procedure.   Delight Ovens MD  Beeper [phone] Office [phone]

## 2012-05-01 NOTE — Consult Note (Addendum)
PULMONARY  / CRITICAL CARE MEDICINE  Name: Nadine Ryle MRN: [id number] DOB: Dec 17, 1955    LOS: 0  REFERRING PROVIDER:   Tyrone Sage   CHIEF COMPLAINT:   S/p FOB for lung abscess 12/17. PCCM asked to see for pulmonary post-op care.   BRIEF PATIENT DESCRIPTION:  This is a 56 year old active smoker w/ remote h/o lung CA. She is s/p RUL ~12 y ago. Since this time she has been treated at [facility] and [place] for what was felt to be chronic pleural space infection. She presented to [facility] on 12/17 for elective bronchoscopy to r/o airway obstruction. PCCM asked to assist w/ post-op pulm care.   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 For the past 6-7 months she's been treated in [facility] and [facility] for a right lung/chest process. 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 Over that time she's become progressively cachectic, unfortunately is still smoking. She is now referred for further opinion on treatment. It appears she was turned down for any surgical intervention in [facility]. She was admitted to cardiothoracic surgery for Bronchoscopy to eval old bronchial stump. PCCM was asked to assist w/ post-op care and assist w/ COPD management.   PAST MEDICAL HISTORY :  Past Medical History  Diagnosis Date  . Depression with anxiety   . COPD (chronic obstructive pulmonary disease)   . Empyema   . GERD (gastroesophageal reflux disease)   . Lung cancer     RULobectomy 2001, stage I  adenocarcinoma T2, N0, Mx    . H/O pulmonary fibrosis   . Tobacco use disorder   . Vocal cord paralysis   . Abnormal weight loss   . Pneumonia   . Pleural effusion   . Thyroid nodule     tiny on right   . Hydropneumothorax   . Normocytic anemia   . Hemoptysis   . Shortness of breath     with exertion   Past Surgical History  Procedure Date  . Lung lobectomy 2001    right upper lobe  . Thoracotomy     right side  . Right forearm surgery after trauma   . Peripherally inserted central catheter insertion   . Eye surgery     corrected "Cross eyes"   Prior to Admission medications   Medication Sig Start Date End Date Taking? Authorizing Provider  albuterol (PROVENTIL HFA;VENTOLIN HFA) 108 (90 BASE) MCG/ACT inhaler Inhale 2 puffs into the lungs 2 (two) times daily as needed. For shortness of breath   Yes Historical Provider, MD  ALPRAZolam Prudy Feeler) 1 MG tablet Take 1 mg by mouth 3 (three) times daily as needed. For anxiety   Yes Historical Provider, MD  dicyclomine (BENTYL) 20 MG tablet Take 20 mg by mouth 2 (two) times daily.    Yes Historical Provider, MD  omeprazole (PRILOSEC) 40 MG capsule Take 40 mg by mouth 2 (two) times daily.     Historical Provider, MD   Allergies  Allergen Reactions  . Codeine Other (See Comments)    Severe anxiety  . Other Other (See Comments)    Steroids  "feels like choking"    FAMILY HISTORY:  History reviewed. No pertinent family history. SOCIAL HISTORY:  reports that she has been smoking.  She does not have any smokeless tobacco history on file. She reports that she does not drink alcohol or use illicit drugs.  REVIEW OF SYSTEMS:   Unable due to residual anesthesia affect.   INTERVAL HISTORY:   VITAL SIGNS: Temp:  [97.7 F (36.5 C)] 97.7 F (36.5 C) (12/17 0800) Pulse Rate:  [86] 86  (12/17 0800) Resp:  [18] 18  (12/17 0800) BP: (97)/(66) 97/66 mmHg (12/17 0800) SpO2:  [94 %] 94 % (12/17 0800)  PHYSICAL EXAMINATION: General:   Frail 56 year old female, not in acute distress.  Neuro:  Awake and oriented  HEENT:  [place], + exophthalmus  Cardiovascular:  rrr Lungs:  No wheeze, decreased on right  Abdomen:  Soft, non-tender  Musculoskeletal:  Intact Skin:  Intact    Lab 05/01/12 0906  NA 136  K 4.0  CL 98  CO2 28  BUN 5*  CREATININE 0.45*  GLUCOSE 95    Lab 05/01/12 0906  HGB 10.1*  HCT 32.4*  WBC 11.5*  PLT 376   Dg Chest 2 View  05/01/2012  *RADIOLOGY REPORT*  Clinical Data: Preop lung lesion.  COPD.  CHEST - 2 VIEW  Comparison: PET 04/19/2012.  Findings: Again noted is the large cavitary process in the right upper quadrant with air-fluid level, similar to prior PET CT. Stable extensive right lung airspace disease.  Postoperative changes on the right.  Left PICC line is in place with the tip in the SVC.  Underlying COPD.  No confluent opacity on the left.  IMPRESSION: Postoperative changes on the right.  Stable diffuse right lung airspace opacity with cavitary process and air-fluid level in the right upper hemithorax.   Original Report Authenticated By: Charlett Nose, M.D.     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  2) COPD (chronic obstructive pulmonary disease): Appears severe and progressive since last CT scan. No evidence if AECOPD currently.    Depression with anxiety 3)  Tobacco use disorder 4) GERD (gastroesophageal reflux disease) 5) Abnormal weight loss: due to #1  This is a cachectic 56 year old woman with failure to thrive in the setting of chronic post-op lung infection after Right upper lobectomy ~12 years ago resulting in chronically infected right pleural space. She also appears to have what looks like advanced Emphysema and is still an active smoker. Per the notes, agree she is not a candidate for pneumonectomy given the advanced stage of her disease. Also  wonder about cancer, but prob low on diff dx.   Recommendation Stop smoking CT chest (eval further the left remaining functional portion of lung. Also further define right disease: effusion etc). Agree w/ current abx scheduled BDs Send Alpha 1 antitrypsin    Billy Fischer, MD ; [facility] service Mobile [phone].  After 5:30 PM or weekends, call [phone]  Pulmonary and Critical Care Medicine [facility] Pager: [phone]  05/01/2012, 3:16 PM

## 2012-05-01 NOTE — Anesthesia Procedure Notes (Signed)
Procedure Name: MAC Date/Time: 05/01/2012 2:10 PM Performed by: Marena Chancy Pre-anesthesia Checklist: Suction available, Emergency Drugs available, Patient identified, Patient being monitored and Timeout performed Patient Re-evaluated:Patient Re-evaluated prior to inductionOxygen Delivery Method: Nasal cannula Preoxygenation: Pre-oxygenation with 100% oxygen Placement Confirmation: positive ETCO2

## 2012-05-01 NOTE — Anesthesia Preprocedure Evaluation (Signed)
Anesthesia Evaluation  Patient identified by MRN, date of birth, ID band Patient awake and Patient confused    Reviewed: Allergy & Precautions, H&P , NPO status , Patient's Chart, lab work & pertinent test results  Airway Mallampati: II      Dental  (+) Teeth Intact and Dental Advisory Given   Pulmonary    + decreased breath sounds+ wheezing      Cardiovascular Rhythm:Regular     Neuro/Psych    GI/Hepatic   Endo/Other    Renal/GU      Musculoskeletal   Abdominal   Peds  Hematology   Anesthesia Other Findings   Reproductive/Obstetrics                           Anesthesia Physical Anesthesia Plan  ASA: III  Anesthesia Plan: General and MAC   Post-op Pain Management:    Induction: Intravenous  Airway Management Planned:   Additional Equipment:   Intra-op Plan:   Post-operative Plan:   Informed Consent: I have reviewed the patients History and Physical, chart, labs and discussed the procedure including the risks, benefits and alternatives for the proposed anesthesia with the patient or authorized representative who has indicated his/her understanding and acceptance.   Dental advisory given  Plan Discussed with: CRNA, Surgeon and Anesthesiologist  Anesthesia Plan Comments: (Chronic R. Lung empyema  H/O lung ca S/P lung resection 13 years ago COPD/ Smoker Anxiety Cachexia  Plan MAC   Kipp Brood, MD)        Anesthesia Quick Evaluation

## 2012-05-01 NOTE — Anesthesia Postprocedure Evaluation (Signed)
  Anesthesia Post-op Note  Patient: [name] [name]  Procedure(s) Performed: Procedure(s) (LRB) with comments: VIDEO BRONCHOSCOPY (N/A) - Video Bronchoscopy  Patient Location: PACU  Anesthesia Type:General  Level of Consciousness: awake, alert  and oriented  Airway and Oxygen Therapy: Patient Spontanous Breathing and Patient connected to nasal cannula oxygen  Post-op Pain: mild  Post-op Assessment: Post-op Vital signs reviewed, Patient's Cardiovascular Status Stable, Respiratory Function Stable and Patent Airway  Post-op Vital Signs: stable  Complications: No apparent anesthesia complications

## 2012-05-01 NOTE — Brief Op Note (Signed)
05/01/2012  3:06 PM  PATIENT:  [name] [name]  56 y.o. female  PRE-OPERATIVE DIAGNOSIS:  LUNG abscess  POST-OPERATIVE DIAGNOSIS:  LUNG abscess  PROCEDURE:  Procedure(s) (LRB) with comments: VIDEO BRONCHOSCOPY (N/A) - Video Bronchoscopy With bx of lesion rt upper lobe bronchial stump   SURGEON:  Surgeon(s) and Role:    * Delight Ovens, MD - Primary  PHYSICIAN ASSISTANT:   ASSISTANTS: none   ANESTHESIA:   general  EBL:  Total I/O In: 500 [I.V.:500] Out: -   BLOOD ADMINISTERED:none  DRAINS: none   LOCAL MEDICATIONS USED:  LIDOCAINE   SPECIMEN:  Source of Specimen:  right upper lobe  DISPOSITION OF SPECIMEN:  PATHOLOGY  COUNTS:  YES  TOURNIQUET:  * No tourniquets in log *  DICTATION: .Other Dictation: Dictation Number   PLAN OF CARE: Admit to inpatient   PATIENT DISPOSITION:  PACU - hemodynamically stable.   Delay start of Pharmacological VTE agent (>24hrs) due to surgical blood loss or risk of bleeding: yes  .

## 2012-05-02 ENCOUNTER — Inpatient Hospital Stay (HOSPITAL_COMMUNITY): Payer: Medicaid Other

## 2012-05-02 LAB — COMPREHENSIVE METABOLIC PANEL
ALT: <5 U/L (ref 0–35)
AST: 8 U/L (ref 0–37)
Alkaline Phosphatase: 72 U/L (ref 39–117)
CO2: 29 mEq/L (ref 19–32)
Chloride: 99 mEq/L (ref 96–112)
GFR calc non Af Amer: >90 mL/min (ref >90)
Potassium: 4.5 mEq/L (ref 3.5–5.1)
Sodium: 135 mEq/L (ref 135–145)
Total Bilirubin: 0.1 mg/dL — ABNORMAL LOW (ref 0.3–1.2)

## 2012-05-02 MED ORDER — SODIUM CHLORIDE 0.9 % IJ SOLN
INTRAMUSCULAR | Status: AC
  Filled 2012-05-02: qty 10

## 2012-05-02 NOTE — Progress Notes (Signed)
Patient's family now at bedside with clothes. Again patient urged to stay until CM could set up a way for patient to have o2 for ride home.  Patient refuses to stay, AMA paperwork signed.  Patient left with family.

## 2012-05-02 NOTE — Progress Notes (Signed)
Utilization review completed.  P.J. Ronette Hank,RN,BSN Case Manager [phone]  

## 2012-05-02 NOTE — Progress Notes (Signed)
AC at bedside talking with patient about helping to get patient a ride home.  AC spoke with patient's family and he states he is now in parking car and has patient's clothes.  Patient does not however have any o2 tanks with her and she is on home o2.  CM paged and message left to see if we could help with o2 for ride home.

## 2012-05-02 NOTE — Progress Notes (Signed)
Patient states that she wants to go home.  Explained to patient that the MD has not released her yet and that MD would be by this am to assess. Patient states that she does not want to wait that long.  Then explained to patient that if she leaves AMA that insurance would not pay for stay and that staff would not be able to provide anymore care for her.  Patient states that she will sign paper work and understands. MD (Dr. Tyrone Sage) made aware and that he understands.

## 2012-05-02 NOTE — Progress Notes (Signed)
Patient states that she does not have a ride home.  SW work paged and message left.  AC notified and states that she will come up and help with situation.

## 2012-05-03 ENCOUNTER — Encounter (HOSPITAL_COMMUNITY): Payer: Self-pay | Admitting: Cardiothoracic Surgery

## 2012-05-04 LAB — CULTURE, RESPIRATORY W GRAM STAIN

## 2012-05-05 NOTE — Op Note (Signed)
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  POSTOPERATIVE DIAGNOSES:  History of previous right upper lobectomy for carcinoma of the lung, now with 6 months of progressive decline and recurrent infections of the residual right lung.  SURGICAL PROCEDURE:  Bronchoscopy and biopsy of right upper lobe bronchial stump.  SURGEON:  Sheliah Plane, MD  BRIEF HISTORY:  The patient is a 56 year old female, who has been cared for in [facility] and [facility] hospitals over the past 6 months with recurrent infection of the right chest.  He now presents on long-term antibiotics and with probable abscess of the residual right lung. Initial evaluation recommended to the patient is to proceed with bronchoscopy to evaluate for any endobronchial lesions.  The patient agreed and signed informed consent.  DESCRIPTION OF PROCEDURE:  The patient was of precarious respiratory status enough to warrant MAC anesthesia initially during the procedure. As we progressed and found significant amount of pulmonary secretions coming from the residual right lung and significant amount of purulent material, we proceeded to move to general endotracheal anesthesia as documented in the anesthesia record.  Initially bronchoscopy was with light intravenous sedation and topical lidocaine in the posterior pharynx.  A fiberoptic bronchoscope was passed through the right naris. This gave good examination of the vocal cords.  Both appeared to be moving well  without any evidence of vocal cord paralysis or tumor.  The scope was passed into the tracheobronchial tree with examination toward the left side first to the subsegmental level.  There were no obvious endobronchial lesions.  There was a significant amount of creamy white material coming from the right.  At this point, we removed the scope, intubated the patient, and proceeded with examination of the right tracheobronchial tree with copious suction and irrigation and removing as much material as possible.  Appropriate cultures were sent.  With this completed, we then moved to careful examination of the tracheobronchial tree on the right.  At the takeoff of the right upper lobe bronchial stump, there was some mounting of tissue.  This was somewhat concerning for possible recurrent tumor.  Through the bronchoscope, several biopsies of this area were obtained and submitted to Pathology for permanent sections.  The scope was removed.  The patient was then awakened and extubated without difficulty and transferred to the recovery room for postoperative care.  Because of the patient's tenuous status, the obvious chronic infection in the remaining right lung and need for definitive care, it was decided to obtain infectious disease and pulmonary consultations and proceed with plans to drain the remaining right lung or consider completion pneumonectomy and Eloesser flap.     Sheliah Plane, MD     EG/MEDQ  D:  05/05/2012  T:  05/05/2012  Job:  [id number]  cc:   Dr. Reche Dixon

## 2012-05-14 ENCOUNTER — Encounter: Payer: Self-pay | Admitting: Physician Assistant

## 2012-05-14 ENCOUNTER — Telehealth: Payer: Self-pay | Admitting: *Deleted

## 2012-05-15 ENCOUNTER — Ambulatory Visit: Payer: Medicaid Other | Admitting: Cardiothoracic Surgery

## 2012-05-15 NOTE — Discharge Summary (Signed)
Physician Discharge Summary  Patient ID: [name] [name] MRN: [id number] DOB/AGE: 1956/01/06 56 y.o.  Admit date: 05/01/2012 Discharge date: 05/15/2012  Admission Diagnoses:  Patient Active Problem List  Diagnosis  . Depression with anxiety  . GERD (gastroesophageal reflux disease)  . Lung cancer  . H/O pulmonary fibrosis  . Tobacco use disorder  . Vocal cord paralysis  . Abnormal weight loss  . Enlarged lymph nodes  . Abscess of lung  . Emphysema   Discharge Diagnoses:   Patient Active Problem List  Diagnosis  . Depression with anxiety  . GERD (gastroesophageal reflux disease)  . Lung cancer  . H/O pulmonary fibrosis  . Tobacco use disorder  . Vocal cord paralysis  . Abnormal weight loss  . Enlarged lymph nodes  . Abscess of lung  . Emphysema   Discharged Condition: good  History of Present Illness:   [name] [name] is a 56 yo white female with history of Lung Cancer S/P Right Upper Lobectomy performed approximately 12 years ago.  For the past 6-7 months the patient has been treated at [facility] and [facility] for a right chest/lung process presumed to be a chronic lung infection.  She has been treated with IV Antibiotics.  However, over time the patient has continued to become progressively cachectic and continues to smoke.  She was referred to [facility] for further option on treatment.  She was initially evaluated by Dr. Tyrone Sage on 04/19/2012 at which time it was felt the patient would benefit from pneumonectomy. She underwent preoperative pulmonary workup and was seen in follow up on 04/26/2012 at which time it was felt she should underwent Bronchoscopy initially to develop a treatment plan.  The patient was agreeable to proceed and the risks and benefits were explained to the patient.     Hospital Course:   [name] [name] presented to [facility] on 05/01/2012.  She was taken to the operating room and underwent Bronchoscopy with biopsy of the right upper lobe  bronchial stump.  During the procedure the patient had tenuous respiratory status requiring progression to General anesthesia.   Due to findings of evidence of chronic infection and patient's respiratory problems during the case it was felt best to admit the patient and obtain ID and Pulmonary consults.  However, the morning after the procedure the patient was unwilling to await evaluation by Dr. Tyrone Sage or consulting services.  The patient signed out AMA.   Treatments: procedures:   Bronchoscopy and biopsy of right upper lobe  bronchial stump.  Disposition: 07-Left Against Medical Advice  Discharge Orders    Future Appointments: Provider: Department: Dept Phone: Center:   05/17/2012 1:30 PM Delight Ovens, MD [facility] [phone] [facility]       Medication List     As of 05/15/2012 10:28 AM    ASK your doctor about these medications         albuterol 108 (90 BASE) MCG/ACT inhaler   Commonly known as: PROVENTIL HFA;VENTOLIN HFA   Inhale 2 puffs into the lungs 2 (two) times daily as needed. For shortness of breath      ALPRAZolam 1 MG tablet   Commonly known as: XANAX   Take 1 mg by mouth 3 (three) times daily as needed. For anxiety      dicyclomine 20 MG tablet   Commonly known as: BENTYL   Take 20 mg by mouth 2 (two) times daily.      omeprazole 40 MG capsule  Commonly known as: PRILOSEC   Take 40 mg by mouth 2 (two) times daily.         SignedLowella Dandy 05/15/2012, 10:28 AM

## 2012-05-17 ENCOUNTER — Ambulatory Visit (INDEPENDENT_AMBULATORY_CARE_PROVIDER_SITE_OTHER): Payer: Medicaid Other | Admitting: Cardiothoracic Surgery

## 2012-05-17 ENCOUNTER — Encounter: Payer: Self-pay | Admitting: Cardiothoracic Surgery

## 2012-05-17 ENCOUNTER — Ambulatory Visit
Admission: RE | Admit: 2012-05-17 | Discharge: 2012-05-17 | Disposition: A | Discharge status: Home / Self Care | Payer: Medicaid Other | Source: Ambulatory Visit | Attending: Cardiothoracic Surgery | Admitting: Cardiothoracic Surgery

## 2012-05-17 ENCOUNTER — Other Ambulatory Visit: Payer: Self-pay | Admitting: Cardiothoracic Surgery

## 2012-05-17 VITALS — BP 109/70 | HR 110 | Resp 18 | Ht 65.0 in | Wt 88.0 lb

## 2012-05-17 DIAGNOSIS — J984 Other disorders of lung: Secondary | ICD-10-CM

## 2012-05-17 DIAGNOSIS — D381 Neoplasm of uncertain behavior of trachea, bronchus and lung: Secondary | ICD-10-CM

## 2012-05-17 DIAGNOSIS — Z85118 Personal history of other malignant neoplasm of bronchus and lung: Secondary | ICD-10-CM

## 2012-05-17 DIAGNOSIS — J189 Pneumonia, unspecified organism: Secondary | ICD-10-CM

## 2012-05-17 NOTE — Progress Notes (Signed)
[address]            [place] [address]          [phone]          Leonard Hendler [facility] Medical Record #[id number] Date of Birth: 26-Sep-1955  Referring: [name], MD Primary Care: [name], MD  Chief Complaint:    Chief Complaint  Patient presents with  . Follow-up    chronic pulmonary infection....further discussion    History of Present Illness:    Patient is a 57 year old female who underwent right upper lobectomy for carcinoma the lung approximately 12 years ago at another institution. For the past 6-7 months she's been treated in [facility] and [facility] for a right lung/chest process. 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 Over that time she's become progressively cachectic, unfortunately is still smoking. She is now referred for further opinion on treatment. It appears she was turned down for any surgical intervention in [facility].   Since seen last week, she feels better, less GI complaints on probiotic. Has decreased smoking but has continued.  2 weeks ago she was admitted to the hospital underwent bronchoscopy clearing outs significant pulmonary secretions, biopsy of the right upper lobe bronchial stump revealed no tumor.  She was being evaluated consultation from pulmonary and infectious disease but before this could be completed and signed out AMA. Since she left her PICC line fell out has not been replaced, and is currently not on antibiotics. She feels well at this point denies fever and chills.    Current Activity/ Functional Status: Patient is independent with mobility/ambulation, transfers, ADL's, IADL's.   Past Medical History  Diagnosis Date  . Depression with anxiety   . COPD (chronic obstructive pulmonary disease)   . Empyema   . GERD (gastroesophageal reflux disease)   .  Lung cancer     RULobectomy 2001, stage I adenocarcinoma T2, N0, Mx    . H/O pulmonary fibrosis   . Tobacco use disorder   . Vocal cord paralysis   . Abnormal weight loss   . Pneumonia   . Pleural effusion   . Thyroid nodule     tiny on right   . Hydropneumothorax   . Normocytic anemia   . Hemoptysis   . Shortness of breath     with exertion    Past Surgical History  Procedure Date  . Lung lobectomy 2001    right upper lobe  . Thoracotomy     right side  . Right forearm surgery after trauma   . Peripherally inserted central catheter insertion   . Eye surgery     corrected "Cross eyes"  . Video bronchoscopy 05/01/2012    Procedure: VIDEO BRONCHOSCOPY;  Surgeon: Delight Ovens, MD;  Location: [facility] OR;  Service: Thoracic;  Laterality: N/A;  Video Bronchoscopy    No family history on file.  History   Social History  . Marital Status: Divorced    Spouse Name: N/A    Number of Children: N/A  . Years of Education: N/A   Occupational History  .  unemployed    Social History Main Topics  . Smoking status: Current Every Day Smoker -- 1.0 packs/day for 38 years  . Smokeless tobacco: Not on file  . Alcohol Use: No  . Drug Use: No  . Sexually Active: Not on file   Other Topics Concern  . Not on file   Social History Narrative    She lives alone, she is divorced,     History  Smoking status  . Current Every Day Smoker -- 1.0 packs/day for 38 years  Smokeless tobacco  . Not on file    History  Alcohol Use No     Allergies  Allergen Reactions  . Codeine Other (See Comments)    Severe anxiety  . Other Other (See Comments)    Steroids  "feels like choking"    Current Outpatient Prescriptions  Medication Sig Dispense Refill  . albuterol (PROVENTIL HFA;VENTOLIN HFA) 108 (90 BASE) MCG/ACT inhaler Inhale 2 puffs into the lungs 2 (two) times daily as needed. For shortness of breath      . ALPRAZolam (XANAX) 1 MG tablet Take 1 mg by mouth 3 (three) times  daily as needed. Takes 11/2 tabs three times daily      . dicyclomine (BENTYL) 20 MG tablet Take 20 mg by mouth 3 (three) times daily. 1 1/2  Tabs t.i.d      . omeprazole (PRILOSEC) 40 MG capsule Take 40 mg by mouth 2 (two) times daily.            Review of Systems:     Cardiac Review of Systems: Y or N  Chest Pain [  n  ]  Resting SOB [ n  ] Exertional SOB  Cove.Etienne  ]  Orthopnea [ n ]   Pedal Edema [  n ]    Palpitations [ n ] Syncope  [n  ]   Presyncope [ n ]  General Review of Systems: [Y] = yes [  ]=no Constitional: recent weight change [ y]; anorexia [ y ]; fatigue [  y]; nausea Cove.Etienne ]; night sweats Cove.Etienne  ]; fever [ y ]; or chills Cove.Etienne  ];                                                                                                                                          Dental: poor dentition[ y ];   Eye : blurred vision [  ]; diplopia [   ]; vision changes [  ];  Amaurosis fugax[  ]; Resp: cough Cove.Etienne  ];  wheezing[ y ];  hemoptysis[ n ]; shortness of breath[ y ]; paroxysmal nocturnal dyspnea[ y]; dyspnea on exertion[  y]; or orthopnea[  ];  GI:  gallstones[  ], vomiting[y  ];  dysphagia[y  ]; melena[ n ];  hematochezia [  ]; heartburn[  ];   Hx of  Colonoscopy[n  ]; GU:  kidney stones [  ]; hematuria[  ];   dysuria [  ];  nocturia[  ];  history of     obstruction [  ];             Skin: rash, swelling[  ];, hair loss[  ];  peripheral edema[  ];  or itching[  ]; Musculosketetal: myalgias[ y ];  joint swelling[  ];  joint erythema[  ];  joint pain[  ];  back pain[  ];  Heme/Lymph: bruising[  ];  bleeding[  ];  anemia[  ];  Neuro: TIA[  ];  headaches[  ];  stroke[ n ];  vertigo[  ];  seizures[  ];   paresthesias[  ];  difficulty walking[  ];  Psych:depression[y  ]; Kendell Bane  ];  Endocrine: diabetes[  ];  thyroid dysfunction[  ];  Immunizations: Flu [ n ]; Pneumococcal[n  ];  Other:  Physical Exam: BP 109/70  Pulse 110  Resp 18  Ht 5\' 5"  (1.651 m)  Wt 88 lb (39.917 kg)  BMI 14.64 kg/m2   SpO2 87%  General appearance: alert, cooperative, appears older than stated age, cachectic, fatigued and mild distress Neurologic: intact Heart: regular rate and rhythm, S1, S2 normal, no murmur, click, rub or gallop and normal apical impulse Lungs: bronchophony LLL and LUL and diminished breath sounds RLL, RML and RUL Abdomen: soft, non-tender; bowel sounds normal; no masses,  no organomegaly Extremities: extremities normal, atraumatic, no cyanosis or edema and Homans sign is negative, no sign of DVT Wound: healed rt chest scar from thoracotomy No palpable cervical or supraclavicular adenopathy Her PICC line is now gone  Diagnostic Studies & Laboratory data:     Recent Radiology Findings:   Dg Chest 2 View  05/17/2012  *RADIOLOGY REPORT*  Clinical Data: Shortness of breath, history of right lung cancer  CHEST - 2 VIEW  Comparison: Chest radiographs dated 05/02/2012.  PET CT dated 04/19/2012.  Findings: Postsurgical changes in the right hemithorax.  Air-fluid level in the right mid lung, corresponding to a hydropneumothorax on prior PET CT, grossly unchanged.  Left lung is notable for chronic interstitial markings/emphysematous changes.  No mediastinal silhouette is mildly shifted to the right.  Visualized osseous structures are within normal limits.  IMPRESSION: Postsurgical changes in the right hemithorax.  Associated right hydropneumothorax, grossly unchanged.   Original Report Authenticated By: Charline Bills, M.D.    Nm Pet Image Restag (ps) Skull Base To Thigh  04/19/2012  *RADIOLOGY REPORT*  Clinical Data: Subsequent treatment strategy for restaging of lung cancer.  NUCLEAR MEDICINE PET SKULL BASE TO THIGH  Fasting Blood Glucose:  89  Technique:  14.1 mCi F-18 FDG was injected intravenously. CT data was obtained and used for attenuation correction and anatomic localization only.  (This was not acquired as a diagnostic CT examination.) Additional exam technical data entered on technologist  worksheet.  Comparison:  Chest CT of 07/14/2011.  Findings:  Neck: Motion degradation between the PET and CT images.  A focus of hypermetabolism which is felt to correspond to an interpolar right-sided thyroid nodule.  This measures 1.0 cm and a S.U.V. max of 4.2 on image 57/series 2.  Chest:  Although  suboptimally evaluated on these non dedicated axial only CT images, a cavitary process involving the superior right hemithorax is favored to represent a loculated hydropneumothorax, secondary to bronchopleural fistula.  There is nonspecific circumferential hypermetabolism within the right pleural space.  Decreased aerationwithin the inferior right lung, with diffuse pulmonary opacification  and foci of extra alveolar air.  This area is relatively diffusely hypermetabolic, and measures a S.U.V. max of 11.3, including on image 105/series 2.  A more inferior area of right-sided pleural based hypermetabolism is at the site of pleural fluid or thickening.  This measures a S.U.V. max of 8.4 on approximately image 118/series 2.  A right paratracheal node measures 1.6 cm and a S.U.V. max of 5.5 on image 79/series 2.  This node is decreased from 2.3 cm on the prior exam.  Separate area of apparent right paratracheal hypermetabolism is secondary to Port-A-Cath injection site.  Abdomen/Pelvis:  Eccentric right posterior pelvic focus of hypermetabolism measures a S.U.V. max of 10.9, including on approximately image 215/series 2.  This projects posterior to the bladder on CT.  No definite uterine mass is seen.  There is a suggestion of fluid density with thin septate involving the right hemi pelvis on image 214/series 2.  Skeleton:  Mild hypermetabolism involving the posterior lateral right ninth rib.  Evaluation of this area is motion degraded on CT. There is no definite underlying lesion identified.  CT  images performed for attenuation correction demonstrate no further findings within the neck. Volume loss in the right  hemithorax.  Severe centrilobular emphysema.  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  Right-sided pleural fluid/thickening, small in volume.  Mild osteopenia.  Probable bone island the right acetabulum.  Postsurgical or traumatic defects of high right ribs. Probable remote right clavicular trauma as well.  IMPRESSION:  1.  Complex appearance of the right hemithorax.  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  High circumferential pleural hypermetabolism is indeterminate and could be inflammatory.  A lower right pleural focus of hypermetabolism is somewhat more focal and pleural metastatic disease cannot be excluded. Dedicated repeat contrast enhanced CT may be informative. 2.  Decreased aeration of the remaining right lung.  Near complete pulmonary opacification with areas of necrosis / extra alveolar air and hypermetabolism.  Findings all could be infectious or related to prior radiation therapy.  Underlying neoplastic process is difficult to exclude. 3.  Suspicious right paratracheal hypermetabolic node; decreased in size since 07/14/2011. 4.  No extra osseous extrathoracic disease identified. 5.  T9 focus of hypermetabolism which is not well evaluated at CT. Favored to be post-traumatic.  Recommend attention on follow-up. 6.  Hypermetabolic right thyroid nodule.  Consider further characterization with ultrasound. 7.  Hypermetabolism in the right paracentral pelvis.  Concurrent suggestion of a cystic process in this region.  Potential etiologies include hypermetabolism from a right hydrosalpinx or ovarian lesion versus an otherwise occult uterine process. Alternatively, this hypermetabolism could be physiologic and due to misregistration of the adjacent bladder.  Pelvic ultrasound should be considered.   Original Report Authenticated By: Jeronimo Greaves, M.D.     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 The CT scans in August compared to nail suggest complete destruction of the remaining lung. The patient is having difficulty taking any nutrition. She continues on IV antibiotics. I reviewed a significant number of records taking more than 2 hours and have discussed on the phone the case with Dr. Oneida Alar from [facility] had previously been following her. It appears she needs a completion  pneumonectomy, and with the infected pleural space and L. lower cervical flap with chronic drainage of the right chest cavity.  I have recommend to her we proceed with bronchoscopy as first evaluation of developing a treatment plan she can survive. We completed this but she then signed out AMA. I reviewed a recommendation in office with her again today. Plan to admit her to the hospital June 6, will have a PICC line placed resume antibiotics obtain pulmonary consultation and proceed early in the week with completion pneumonectomy or at least drainage of the infected right pleural space.  Delight Ovens MD  Beeper [phone] Office [phone]

## 2012-05-21 ENCOUNTER — Encounter (HOSPITAL_COMMUNITY): Payer: Self-pay | Admitting: General Practice

## 2012-05-21 ENCOUNTER — Inpatient Hospital Stay (HOSPITAL_COMMUNITY): Payer: Medicaid Other

## 2012-05-21 ENCOUNTER — Inpatient Hospital Stay (HOSPITAL_COMMUNITY)
Admission: AD | Admit: 2012-05-21 | Discharge: 2012-06-01 | DRG: 166 | Disposition: A | Discharge status: Skilled Nursing Facility | Payer: Medicaid Other | Source: Ambulatory Visit | Attending: Cardiothoracic Surgery | Admitting: Cardiothoracic Surgery

## 2012-05-21 DIAGNOSIS — G8929 Other chronic pain: Secondary | ICD-10-CM | POA: Diagnosis present

## 2012-05-21 DIAGNOSIS — F3130 Bipolar disorder, current episode depressed, mild or moderate severity, unspecified: Secondary | ICD-10-CM | POA: Diagnosis present

## 2012-05-21 DIAGNOSIS — Z9119 Patient's noncompliance with other medical treatment and regimen: Secondary | ICD-10-CM

## 2012-05-21 DIAGNOSIS — Y838 Other surgical procedures as the cause of abnormal reaction of the patient, or of later complication, without mention of misadventure at the time of the procedure: Secondary | ICD-10-CM | POA: Diagnosis not present

## 2012-05-21 DIAGNOSIS — R64 Cachexia: Secondary | ICD-10-CM | POA: Diagnosis present

## 2012-05-21 DIAGNOSIS — J852 Abscess of lung without pneumonia: Secondary | ICD-10-CM

## 2012-05-21 DIAGNOSIS — J9610 Chronic respiratory failure, unspecified whether with hypoxia or hypercapnia: Secondary | ICD-10-CM | POA: Diagnosis present

## 2012-05-21 DIAGNOSIS — J449 Chronic obstructive pulmonary disease, unspecified: Secondary | ICD-10-CM | POA: Diagnosis present

## 2012-05-21 DIAGNOSIS — Z85118 Personal history of other malignant neoplasm of bronchus and lung: Secondary | ICD-10-CM

## 2012-05-21 DIAGNOSIS — Z681 Body mass index (BMI) 19 or less, adult: Secondary | ICD-10-CM

## 2012-05-21 DIAGNOSIS — J438 Other emphysema: Secondary | ICD-10-CM | POA: Diagnosis present

## 2012-05-21 DIAGNOSIS — J869 Pyothorax without fistula: Principal | ICD-10-CM | POA: Diagnosis present

## 2012-05-21 DIAGNOSIS — R112 Nausea with vomiting, unspecified: Secondary | ICD-10-CM | POA: Diagnosis not present

## 2012-05-21 DIAGNOSIS — Z91199 Patient's noncompliance with other medical treatment and regimen due to unspecified reason: Secondary | ICD-10-CM

## 2012-05-21 DIAGNOSIS — R634 Abnormal weight loss: Secondary | ICD-10-CM

## 2012-05-21 DIAGNOSIS — K219 Gastro-esophageal reflux disease without esophagitis: Secondary | ICD-10-CM | POA: Diagnosis present

## 2012-05-21 DIAGNOSIS — R51 Headache: Secondary | ICD-10-CM | POA: Diagnosis not present

## 2012-05-21 DIAGNOSIS — F17200 Nicotine dependence, unspecified, uncomplicated: Secondary | ICD-10-CM | POA: Diagnosis present

## 2012-05-21 DIAGNOSIS — D62 Acute posthemorrhagic anemia: Secondary | ICD-10-CM | POA: Diagnosis not present

## 2012-05-21 DIAGNOSIS — J95821 Acute postprocedural respiratory failure: Secondary | ICD-10-CM

## 2012-05-21 DIAGNOSIS — D638 Anemia in other chronic diseases classified elsewhere: Secondary | ICD-10-CM | POA: Diagnosis present

## 2012-05-21 DIAGNOSIS — K5900 Constipation, unspecified: Secondary | ICD-10-CM | POA: Diagnosis not present

## 2012-05-21 DIAGNOSIS — E46 Unspecified protein-calorie malnutrition: Secondary | ICD-10-CM | POA: Diagnosis present

## 2012-05-21 DIAGNOSIS — C3490 Malignant neoplasm of unspecified part of unspecified bronchus or lung: Secondary | ICD-10-CM

## 2012-05-21 DIAGNOSIS — J90 Pleural effusion, not elsewhere classified: Secondary | ICD-10-CM | POA: Diagnosis not present

## 2012-05-21 DIAGNOSIS — T8182XA Emphysema (subcutaneous) resulting from a procedure, initial encounter: Secondary | ICD-10-CM | POA: Diagnosis not present

## 2012-05-21 DIAGNOSIS — F1120 Opioid dependence, uncomplicated: Secondary | ICD-10-CM | POA: Diagnosis present

## 2012-05-21 DIAGNOSIS — F411 Generalized anxiety disorder: Secondary | ICD-10-CM | POA: Diagnosis present

## 2012-05-21 LAB — COMPREHENSIVE METABOLIC PANEL
AST: 11 U/L (ref 0–37)
Alkaline Phosphatase: 67 U/L (ref 39–117)
BUN: 6 mg/dL (ref 6–23)
CO2: 32 mEq/L (ref 19–32)
Chloride: 97 mEq/L (ref 96–112)
Creatinine, Ser: 0.37 mg/dL — ABNORMAL LOW (ref 0.50–1.10)
GFR calc non Af Amer: >90 mL/min (ref >90)
Potassium: 4 mEq/L (ref 3.5–5.1)
Total Bilirubin: 0.1 mg/dL — ABNORMAL LOW (ref 0.3–1.2)

## 2012-05-21 LAB — URINALYSIS, ROUTINE W REFLEX MICROSCOPIC
Bilirubin Urine: NEGATIVE
Glucose, UA: NEGATIVE mg/dL
Hgb urine dipstick: NEGATIVE
Ketones, ur: NEGATIVE mg/dL
Leukocytes, UA: NEGATIVE
Nitrite: NEGATIVE
Protein, ur: NEGATIVE mg/dL
Specific Gravity, Urine: 1.038 — ABNORMAL HIGH (ref 1.005–1.030)
Urobilinogen, UA: 0.2 mg/dL (ref 0.0–1.0)
pH: 8.5 — ABNORMAL HIGH (ref 5.0–8.0)

## 2012-05-21 LAB — CBC
MCH: 28.3 pg (ref 26.0–34.0)
Platelets: 317 10*3/uL (ref 150–400)
RBC: 3.14 MIL/uL — ABNORMAL LOW (ref 3.87–5.11)
RDW: 15.3 % (ref 11.5–15.5)

## 2012-05-21 LAB — PROTIME-INR: Prothrombin Time: 13.3 seconds (ref 11.6–15.2)

## 2012-05-21 MED ORDER — BUDESONIDE 0.25 MG/2ML IN SUSP
0.5000 mg | Freq: Two times a day (BID) | RESPIRATORY_TRACT | Status: DC
  Administered 2012-05-21 – 2012-05-22 (×3): 0.5 mg via RESPIRATORY_TRACT
  Filled 2012-05-21 (×9): qty 4

## 2012-05-21 MED ORDER — IOHEXOL 300 MG/ML  SOLN
80.0000 mL | Freq: Once | INTRAMUSCULAR | Status: AC | PRN
  Administered 2012-05-21: 80 mL via INTRAVENOUS

## 2012-05-21 MED ORDER — NICOTINE 14 MG/24HR TD PT24
14.0000 mg | MEDICATED_PATCH | Freq: Every day | TRANSDERMAL | Status: DC
  Administered 2012-05-21 – 2012-06-01 (×11): 14 mg via TRANSDERMAL
  Filled 2012-05-21 (×12): qty 1

## 2012-05-21 MED ORDER — ALBUTEROL SULFATE HFA 108 (90 BASE) MCG/ACT IN AERS
2.0000 | INHALATION_SPRAY | Freq: Two times a day (BID) | RESPIRATORY_TRACT | Status: DC | PRN
  Filled 2012-05-21: qty 6.7

## 2012-05-21 MED ORDER — PANTOPRAZOLE SODIUM 40 MG PO TBEC
40.0000 mg | DELAYED_RELEASE_TABLET | Freq: Every day | ORAL | Status: DC
  Administered 2012-05-21 – 2012-05-22 (×2): 40 mg via ORAL
  Filled 2012-05-21 (×2): qty 1

## 2012-05-21 MED ORDER — ENSURE PO LIQD: 237.0000 mL | Freq: Three times a day (TID) | ORAL | Status: DC

## 2012-05-21 MED ORDER — ENSURE COMPLETE PO LIQD
237.0000 mL | Freq: Three times a day (TID) | ORAL | Status: DC
  Administered 2012-05-21 – 2012-05-22 (×4): 237 mL via ORAL

## 2012-05-21 MED ORDER — SODIUM CHLORIDE 0.9 % IJ SOLN: 10.0000 mL | INTRAMUSCULAR | Status: DC | PRN

## 2012-05-21 MED ORDER — ALPRAZOLAM 0.5 MG PO TABS
0.5000 mg | ORAL_TABLET | Freq: Three times a day (TID) | ORAL | Status: DC | PRN
  Filled 2012-05-21: qty 1

## 2012-05-21 MED ORDER — ALPRAZOLAM 0.5 MG PO TABS
1.0000 mg | ORAL_TABLET | Freq: Three times a day (TID) | ORAL | Status: DC | PRN
  Administered 2012-05-21 – 2012-05-22 (×3): 1 mg via ORAL
  Administered 2012-05-22: 0.5 mg via ORAL
  Administered 2012-05-22 – 2012-05-24 (×3): 1 mg via ORAL
  Filled 2012-05-21 (×4): qty 2
  Filled 2012-05-21 (×2): qty 1
  Filled 2012-05-21 (×2): qty 2

## 2012-05-21 MED ORDER — SODIUM CHLORIDE 0.9 % IV SOLN
250.0000 mg | Freq: Three times a day (TID) | INTRAVENOUS | Status: DC
  Administered 2012-05-21 – 2012-06-01 (×33): 250 mg via INTRAVENOUS
  Filled 2012-05-21 (×40): qty 250

## 2012-05-21 MED ORDER — SODIUM CHLORIDE 0.9 % IV SOLN
100.0000 mg | Freq: Every day | INTRAVENOUS | Status: DC
  Administered 2012-05-21 – 2012-05-25 (×4): 100 mg via INTRAVENOUS
  Filled 2012-05-21 (×5): qty 100

## 2012-05-21 MED ORDER — ADULT MULTIVITAMIN W/MINERALS CH
1.0000 | ORAL_TABLET | Freq: Every day | ORAL | Status: DC
  Administered 2012-05-21 – 2012-05-22 (×2): 1 via ORAL
  Filled 2012-05-21 (×5): qty 1

## 2012-05-21 MED ORDER — SODIUM CHLORIDE 0.9 % IV SOLN
500.0000 mg | Freq: Three times a day (TID) | INTRAVENOUS | Status: DC
  Filled 2012-05-21 (×2): qty 500

## 2012-05-21 MED ORDER — TIOTROPIUM BROMIDE MONOHYDRATE 18 MCG IN CAPS
18.0000 ug | ORAL_CAPSULE | Freq: Every day | RESPIRATORY_TRACT | Status: DC
  Administered 2012-05-22: 18 ug via RESPIRATORY_TRACT
  Filled 2012-05-21: qty 5

## 2012-05-21 MED ORDER — ONDANSETRON HCL 4 MG/2ML IJ SOLN
4.0000 mg | Freq: Three times a day (TID) | INTRAMUSCULAR | Status: DC | PRN
  Administered 2012-05-21 – 2012-05-22 (×2): 4 mg via INTRAVENOUS
  Filled 2012-05-21 (×2): qty 2

## 2012-05-21 MED ORDER — ACETAMINOPHEN 325 MG PO TABS
650.0000 mg | ORAL_TABLET | Freq: Four times a day (QID) | ORAL | Status: DC | PRN
  Administered 2012-05-21 – 2012-05-22 (×4): 650 mg via ORAL
  Filled 2012-05-21 (×3): qty 2

## 2012-05-21 NOTE — Consult Note (Signed)
INFECTIOUS DISEASE CONSULT NOTE  Date of Admission:  05/21/2012  Date of Consult:  05/21/2012  Reason for Consult:Lung Abscess Referring Physician: Tyrone Sage  Impression/Recommendation Lung Abscess Bipolar d/o COPD  Would- Restart her anbx to include fungal and anaerobic coverage.  Consider sampling of fluid (larger volume) Surgery is aware (this may be her best option) Check HIV and Hepatitis studies CT chest pending  Comment- very large fluid collection on her CXR concerning for abscess however she is relatively stable (left AMA for several days and comes back non-toxic). TB seems less likely given her negative studies but would not r/o. Candida is not typically a pathogen in normal hosts. I am not sure she is a normal host in this regard.    Thank you so much for this interesting consult,   [name] [name] [phone]  [name] [name] is an 57 y.o. female who is a poor historian.  HPI: 57 yo F with hx of Lung CA 13 years ago (surgery, chemotherapy, "god cured me"), COPD and bipolar. She gives a history of multiple hospitalizations voer the lat 6 months, [facility], [address], [address], [place]. She was in [facility] 05-01-12 until 12-31 (when she left AMA).  She has been on long term IV anbx for a R lung/chest infection. She was admitted to [facility] for a second opinion (turned down at [facility]) for her proposed lung surgery. She underwent bonch on 12-17 and had Bx as well (benign tissue, Cx C albicans, AFB ngtd).  She left AMA 12-31on IV anbx, while at home her PIC fell out.  She states that she now is for her lung infection. ROS: she denies f/c, she continues to smoke 1/2ppd (down from 1ppd), she has a cough that is occasionally productive of green sputum, she denies hemoptysis. She has lost 27# in the last 6 months. She has normal BM, normal appetite and normal urination.  Denies LAN.   Sept 2013- [place]- resp cx (-/urf),   August 2013 [facility] BAL- 2 different pseudomonas (s- ceftaz) December 09, 2011  [place] [facility]- resp cx (-)/AFB (-)/fungus (C albicans)  Past Medical History  Diagnosis Date  . Depression with anxiety   . COPD (chronic obstructive pulmonary disease)   . Empyema   . GERD (gastroesophageal reflux disease)   . Lung cancer     RULobectomy 2001, stage I adenocarcinoma T2, N0, Mx    . H/O pulmonary fibrosis   . Tobacco use disorder   . Vocal cord paralysis   . Abnormal weight loss   . Pneumonia   . Pleural effusion   . Thyroid nodule     tiny on right   . Hydropneumothorax   . Normocytic anemia   . Hemoptysis   . Shortness of breath     with exertion    Past Surgical History  Procedure Date  . Lung lobectomy 2001    right upper lobe  . Thoracotomy     right side  . Right forearm surgery after trauma   . Peripherally inserted central catheter insertion   . Eye surgery     corrected "Cross eyes"  . Video bronchoscopy 05/01/2012    Procedure: VIDEO BRONCHOSCOPY;  Surgeon: Delight Ovens, MD;  Location: [facility] OR;  Service: Thoracic;  Laterality: N/A;  Video Bronchoscopy     Allergies  Allergen Reactions  . Codeine Itching and Other (See Comments)    Severe anxiety  . Other Other (See Comments)    Steroids  "feels like choking"  Medications:  Scheduled:   . ENSURE  237 mL Oral TID WC  . nicotine  14 mg Transdermal Daily  . pantoprazole  40 mg Oral Daily     Social History:  reports that she has been smoking.  She does not have any smokeless tobacco history on file. She reports that she does not drink alcohol or use illicit drugs.  No family history on file.  General ROS: see above  Blood pressure 111/66, pulse 100, temperature 97.9 F (36.6 C), temperature source Oral, resp. rate 18, SpO2 90.00%. General appearance: alert, cooperative, no distress and becomes tearful while discussing the recent death of her sister Eyes: negative findings: pupils equal, round, reactive to light and accomodation Throat: normal findings: teeth intact,  non-carious and oropharynx pink & moist without lesions or evidence of thrush Neck: no adenopathy and supple, symmetrical, trachea midline Lungs: diminished breath sounds RLL Heart: regular rate and rhythm Abdomen: normal findings: bowel sounds normal and soft, non-tender Extremities: edema none Lymph nodes: Cervical, supraclavicular, and axillary nodes normal.   No results found for this or any previous visit (from the past 48 hour(s)).    Component Value Date/Time   SDES BRONCHIAL WASHINGS 05/01/2012 1412   SDES BRONCHIAL WASHINGS 05/01/2012 1412   SDES BRONCHIAL WASHINGS 05/01/2012 1412   SPECREQUEST NONE 05/01/2012 1412   SPECREQUEST NONE 05/01/2012 1412   SPECREQUEST NONE 05/01/2012 1412   CULT Non-Pathogenic Oropharyngeal-type Flora Isolated. 05/01/2012 1412   CULT CANDIDA ALBICANS 05/01/2012 1412   CULT CULTURE WILL BE EXAMINED FOR 6 WEEKS BEFORE ISSUING A FINAL REPORT 05/01/2012 1412   REPTSTATUS 05/04/2012 FINAL 05/01/2012 1412   REPTSTATUS PENDING 05/01/2012 1412   REPTSTATUS PENDING 05/01/2012 1412   No results found. No results found for this or any previous visit (from the past 240 hour(s)).    05/21/2012, 11:02 AM     LOS: 0 days

## 2012-05-21 NOTE — Progress Notes (Signed)
INITIAL NUTRITION ASSESSMENT  DOCUMENTATION CODES Per approved criteria  -Severe malnutrition in the context of chronic illness -Underweight   INTERVENTION:  Continue Ensure Complete 3 times daily with meals (350 kcals, 13 gm protein per 8 fl oz bottle)  Add MVI daily RD to follow for nutrition care plan  NUTRITION DIAGNOSIS: Increased nutrient needs related to pre/post-op healing as evidenced by estimated nutrition needs  Goal: Oral intake with meals & supplements to meet >/= 90% of estimated nutrition needs  Monitor:  PO & supplemental intake, weight, labs, I/O's  Reason for Assessment: Consult 57 y.o. female  Admitting Dx: lung abscess   ASSESSMENT: Patient underwent right upper lobectomy for carcinoma the lung approximately 12 years ago; for the past 6-7 months she's been treated for chronic lung infection in the remaining right lung; been on long term antibiotics by PICC line; admitted for further work up for pneumonectomy vs drainage of pleural space infection.   RD unable to obtain nutrition hx at this time ---> PICC line placement in process; PO intake 100% per flowsheet records; per weight records, patient has lost approximately 6 lbs in the past month; patient very likely to have visible muscle and subcutaneous fat loss to upper & lower body.  Patient meets criteria for severe malnutrition in the context of chronic illness given 6% weight loss x 1 month and severe muscle and subcutaneous fat loss.  Height: Ht Readings from Last 1 Encounters:  05/21/12 5\' 5"  (1.651 m)    Weight: Wt Readings from Last 1 Encounters:  05/21/12 88 lb 6.5 oz (40.1 kg)    Ideal Body Weight: 125 lb  % Ideal Body Weight: 70%  Wt Readings from Last 10 Encounters:  05/21/12 88 lb 6.5 oz (40.1 kg)  05/17/12 88 lb (39.917 kg)  05/02/12 94 lb 5.7 oz (42.8 kg)  05/02/12 94 lb 5.7 oz (42.8 kg)  04/26/12 94 lb (42.638 kg)  04/19/12 94 lb (42.638 kg)    Usual Body Weight: 94 lb  %  Usual Body Weight: 93%  BMI:  Body mass index is 14.71 kg/(m^2).  Estimated Nutritional Needs: Kcal: 1400-1600 Protein: 65-75 gm Fluid: > 1.5 L  Skin: Intact  Diet Order: Cardiac  EDUCATION NEEDS: -No education needs identified at this time   Intake/Output Summary (Last 24 hours) at 05/21/12 1525 Last data filed at 05/21/12 1300  Gross per 24 hour  Intake    360 ml  Output    200 ml  Net    160 ml    Last BM: 1/5  Labs:   Lab 05/21/12 1125  NA 137  K 4.0  CL 97  CO2 32  BUN 6  CREATININE 0.37*  CALCIUM 9.1  MG --  PHOS --  GLUCOSE 163*    Scheduled Meds:   . feeding supplement  237 mL Oral TID WC  . imipenem-cilastatin  250 mg Intravenous Q8H  . micafungin (MYCAMINE) IV  100 mg Intravenous Daily  . nicotine  14 mg Transdermal Daily  . pantoprazole  40 mg Oral Daily    Continuous Infusions:   Past Medical History  Diagnosis Date  . Depression with anxiety   . COPD (chronic obstructive pulmonary disease)   . Empyema   . GERD (gastroesophageal reflux disease)   . Lung cancer     RULobectomy 2001, stage I adenocarcinoma T2, N0, Mx    . H/O pulmonary fibrosis   . Tobacco use disorder   . Vocal cord paralysis   .  Abnormal weight loss   . Pneumonia   . Pleural effusion   . Thyroid nodule     tiny on right   . Hydropneumothorax   . Normocytic anemia   . Hemoptysis   . Shortness of breath     with exertion    Past Surgical History  Procedure Date  . Lung lobectomy 2001    right upper lobe  . Thoracotomy     right side  . Right forearm surgery after trauma   . Peripherally inserted central catheter insertion   . Eye surgery     corrected "Cross eyes"  . Video bronchoscopy 05/01/2012    Procedure: VIDEO BRONCHOSCOPY;  Surgeon: Delight Ovens, MD;  Location: [facility] OR;  Service: Thoracic;  Laterality: N/A;  Video Bronchoscopy    Kirkland Hun, RD, LDN Pager #: [phone] After-Hours Pager #: [phone]

## 2012-05-21 NOTE — Progress Notes (Signed)
Peripherally Inserted Central Catheter/Midline Placement  The IV Nurse has discussed with the patient and/or persons authorized to consent for the patient, the purpose of this procedure and the potential benefits and risks involved with this procedure.  The benefits include less needle sticks, lab draws from the catheter and patient may be discharged home with the catheter.  Risks include, but not limited to, infection, bleeding, blood clot (thrombus formation), and puncture of an artery; nerve damage and irregular heat beat.  Alternatives to this procedure were also discussed.  PICC/Midline Placement Documentation  PICC / Midline Double Lumen PICC Right Brachial (Active)  Indication for Insertion or Continuance of Line Poor Vasculature-patient has had multiple peripheral attempts or PIVs lasting less than 24 hours 05/01/2012  8:00 PM  Site Assessment Clean;Dry;Intact 05/01/2012  8:00 PM  Lumen #1 Status Infusing 05/01/2012  8:00 PM  Lumen #2 Status Saline locked 05/01/2012  8:00 PM  Dressing Type Transparent 05/01/2012  8:00 PM  Dressing Status Clean;Dry;Intact 05/01/2012  8:00 PM       Franne Grip [name] 05/21/2012, 3:19 PM

## 2012-05-21 NOTE — Consult Note (Signed)
PULMONARY  / CRITICAL CARE MEDICINE  Name: [name] [name] MRN: [id number] DOB: [birth year]    LOS: 0  REFERRING PROVIDER:   Tyrone Sage   CHIEF COMPLAINT:   Lung Abscess  BRIEF PATIENT DESCRIPTION:  57 year old active smoker w/ remote h/o lung CA. She is s/p RULobectomy~12 y ago. Since this time she has been treated at [facility] and [facility] for what was felt to be chronic pleural space infection. She presented to [facility] on 12/17 for elective bronchoscopy in setting of progressive wasting / cachexia which demonstrated no airway obstruction but significant pulmonary secretions.  She discharged AMA prior eval being completed.  Followed up in office with Dr. Tyrone Sage 1/2 and was re-admitted 1/6 for planned completion pneumonectomy / drainage of infected R pleural space.  12/13 FEV1 1.10 (39%) & DLCO 20%   LINES / TUBES: LUE PICC 1/6 >>   CULTURES: AFB 12/17>>>no AFB>>> Fungus 12/17>>>neg>>> resp culture 12/17>>>abundant wbc's, non-pathogenic flora  ANTIBIOTICS:   SIGNIFICANT EVENTS:  12/17 - Admit for bronch in setting of progressive wasting, neg for tumor but significant secretions 12/31 - LEFT AMA 1/6 -     Re-admit from [facility] office for further work up - pneumonectomy / drainage of pleural space infection    HISTORY OF PRESENT ILLNESS:   Patient is a 57 year old female who underwent right upper lobectomy for carcinoma the lung approximately 12 years ago at another institution. For the past 6-7 months she's been treated in [facility] and [facility] for a right lung/chest process. 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 Over that time she's become progressively cachectic, unfortunately is still smoking. She is now referred for further opinion on treatment. It appears she was turned down for any surgical intervention in [facility]. She was admitted to cardiothoracic surgery for Bronchoscopy to eval old  bronchial stump 12/17. Bronch negative for tumor, culture negative.  Patient left [facility] 12/31.  Returned to Dr. Ysidro Evert office 1/2 and planned re-admit 1/6 for further work up for pneumonectomy vs. Drainage of pleural space infection.  PCCM was asked to assist w/ post-op care and assist w/ COPD management.   PAST MEDICAL HISTORY :  Past Medical History  Diagnosis Date  . Depression with anxiety   . COPD (chronic obstructive pulmonary disease)   . Empyema   . GERD (gastroesophageal reflux disease)   . Lung cancer     RULobectomy 2001, stage I adenocarcinoma T2, N0, Mx    . H/O pulmonary fibrosis   . Tobacco use disorder   . Vocal cord paralysis   . Abnormal weight loss   . Pneumonia   . Pleural effusion   . Thyroid nodule     tiny on right   . Hydropneumothorax   . Normocytic anemia   . Hemoptysis   . Shortness of breath     with exertion   Past Surgical History  Procedure Date  . Lung lobectomy 2001    right upper lobe  . Thoracotomy     right side  . Right forearm surgery after trauma   . Peripherally inserted central catheter insertion   . Eye surgery     corrected "Cross eyes"  . Video bronchoscopy 05/01/2012    Procedure: VIDEO BRONCHOSCOPY;  Surgeon: Delight Ovens, MD;  Location: [facility] OR;  Service: Thoracic;  Laterality: N/A;  Video Bronchoscopy   Prior to Admission medications   Medication Sig Start  Date End Date Taking? Authorizing Provider  albuterol (PROVENTIL HFA;VENTOLIN HFA) 108 (90 BASE) MCG/ACT inhaler Inhale 2 puffs into the lungs 2 (two) times daily as needed. For shortness of breath   Yes Historical Provider, MD  ALPRAZolam Prudy Feeler) 1 MG tablet Take 1 mg by mouth 3 (three) times daily as needed. For anxiety   Yes Historical Provider, MD  dicyclomine (BENTYL) 20 MG tablet Take 20 mg by mouth 2 (two) times daily.    Yes Historical Provider, MD  omeprazole (PRILOSEC) 40 MG capsule Take 40 mg by mouth 2 (two) times daily.     Historical Provider, MD    Allergies  Allergen Reactions  . Codeine Itching and Other (See Comments)    Severe anxiety  . Other Other (See Comments)    Steroids  "feels like choking"    FAMILY HISTORY:  No family history on file.  SOCIAL HISTORY:  reports that she has been smoking.  She does not have any smokeless tobacco history on file. She reports that she does not drink alcohol or use illicit drugs.  REVIEW OF SYSTEMS:   Gen: Denies fever, chills, fatigue, night sweats.  Indicates weight loss.   HEENT: Denies blurred vision, double vision, hearing loss, tinnitus, sinus congestion, rhinorrhea, sore throat, neck stiffness, dysphagia PULM: Denies hemoptysis, wheezing.  Indicates cough with green sputum production, shortness of breath.   CV: Denies chest pain, edema, orthopnea, paroxysmal nocturnal dyspnea, palpitations GI: Denies abdominal pain, nausea, vomiting, diarrhea, hematochezia, melena, constipation, change in bowel habits GU: Denies dysuria, hematuria, polyuria, oliguria, urethral discharge Endocrine: Denies hot or cold intolerance, polyuria, polyphagia or appetite change Derm: Denies rash, dry skin, scaling or peeling skin change Heme: Denies easy bruising, bleeding, bleeding gums Neuro: Denies headache, numbness, weakness, slurred speech, loss of memory or consciousness   INTERVAL HISTORY:   No acute distress.  Concerned about surgery options.    VITAL SIGNS: Temp:  [97.9 F (36.6 C)] 97.9 F (36.6 C) (01/06 0947) Pulse Rate:  [100] 100  (01/06 0947) Resp:  [18] 18  (01/06 0947) BP: (111)/(66) 111/66 mmHg (01/06 0947) SpO2:  [90 %] 90 % (01/06 0947)  PHYSICAL EXAMINATION: General:  Frail, cachectic female, not in acute distress.  Neuro:  Awake and oriented  HEENT:  Luna, mm pink/moist, no jvd Cardiovascular:  rrr Lungs:  No wheeze, decreased on right  Abdomen:  Soft, non-tender  Musculoskeletal:  Intact Skin:  Intact   No results found for this basename:  NA:3,K:3,CL:3,CO2:3,BUN:3,CREATININE:3,GLUCOSE:3 in the last 168 hours No results found for this basename: HGB:3,HCT:3,WBC:3,PLT:3 in the last 168 hours No results found.  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   PLAN: -Needless to say, with her marginal lung function, she is high risk for pneumonectomy. -will continue to follow for post op needs, likely will be difficult to liberate from mechanical ventilation -CT chest pending -place PICC line for ABX  COPD (chronic obstructive pulmonary disease): Appears severe and progressive since last CT scan. No evidence if AECOPD currently. Alpha 1 - 278 from previous admit.   PLAN: -PRN albuterol -no indication for steroids -Add spiriva, pulmicort nebs  Tobacco Abuse - continues to smoke 10 cigs per day  PLAN: -nicotine patch -smoking cessation counseling  Depression with anxiety  PLAN: -per Primary SVC  GERD (gastroesophageal reflux disease) Abnormal weight loss: due to #1  PLAN: -nutrition consult -ensure -protonix  Canary Brim, NP-C [facility] Pgr: [phone] or [phone]  Independently examined pt, evaluated data & formulated above care plan with NP  [facility] V. [phone]  05/21/2012, 11:14 AM

## 2012-05-21 NOTE — H&P (Addendum)
Subjective:   [name] [name] is a 57 yo white female well known to [facility].  She has a history of Lung Cancer S/P Right Upper Lobectomy performed approximately 12 years ago. For the past 6-7 months the patient has been treated at [facility] and [facility] for a right chest/lung process presumed to be a chronic lung infection. She has been treated with IV Antibiotics. However, over time the patient has continued to become progressively cachectic and continues to smoke. She was referred to [facility] for further option on treatment. She was initially evaluated by Dr. Tyrone Sage on 04/19/2012 at which time it was felt the patient would benefit from pneumonectomy. She underwent preoperative pulmonary workup and was seen in follow up on 04/26/2012 at which time it was felt she should underwent Bronchoscopy initially to develop a treatment plan.  She presented to [facility] on 05/01/2012 and underwent Bronchoscopy with  Biopsy of the right upper lobe bronchial stump.  During the procedure patient had tenuous respiratory status requiring progression to general anesthesia.  She was subsequently admitted for further workup, however patient signed out AMA prior to this being completed.  She presented for follow up on 05/17/2012 at which time the cultures and pathology were reviewed with the patient by Dr. Tyrone Sage.  These results did not show evidence of bacterial or recurrent malignancy.  The patient was again educated about the need for Pneumonectomy as treatment plan for her chronic lung infection.  The risks and benefits of the procedure were explained to the patient and she was agreeable to proceed.  The patient presents today for direct admission.  She is scheduled to undergo PICC Line placement and repeat CT scan prior to proceeding with surgery.  Currently the patient has no complaints.  She does have multiple concerns about proceeding with surgery.  She states she does not want to have to use oxygen long term,  stating she hates to carry the tank around.  She is also very concerned about her Xanax regimen while in the hospital and wants to make sure that we adhere to her outpatient regimen due to her extreme anxiety.  She continues to smoke, but states she is down to a half to full pack per day and wishes to quit.  She denies any fevers, chills, or sweats.       Patient Active Problem List   Diagnosis Date Noted  . Abscess of lung 05/01/2012  . Emphysema 05/01/2012  . Enlarged lymph nodes 04/19/2012  . Depression with anxiety   . GERD (gastroesophageal reflux disease)   . Lung cancer   . H/O pulmonary fibrosis   . Tobacco use disorder   . Vocal cord paralysis   . Abnormal weight loss    Past Medical History  Diagnosis Date  . Depression with anxiety   . COPD (chronic obstructive pulmonary disease)   . Empyema   . GERD (gastroesophageal reflux disease)   . Lung cancer     RULobectomy 2001, stage I adenocarcinoma T2, N0, Mx    . H/O pulmonary fibrosis   . Tobacco use disorder   . Vocal cord paralysis   . Abnormal weight loss   . Pneumonia   . Pleural effusion   . Thyroid nodule     tiny on right   . Hydropneumothorax   . Normocytic anemia   . Hemoptysis   . Shortness of breath     with exertion    Past Surgical History  Procedure Date  .  Lung lobectomy 2001    right upper lobe  . Thoracotomy     right side  . Right forearm surgery after trauma   . Peripherally inserted central catheter insertion   . Eye surgery     corrected "Cross eyes"  . Video bronchoscopy 05/01/2012    Procedure: VIDEO BRONCHOSCOPY;  Surgeon: Delight Ovens, MD;  Location: [facility] OR;  Service: Thoracic;  Laterality: N/A;  Video Bronchoscopy    Prescriptions prior to admission  Medication Sig Dispense Refill  . albuterol (PROVENTIL HFA;VENTOLIN HFA) 108 (90 BASE) MCG/ACT inhaler Inhale 2 puffs into the lungs every 4 (four) hours as needed. For shortness of breath      . ALPRAZolam (XANAX) 1 MG tablet  Take 1.5 mg by mouth 3 (three) times daily.       Marland Kitchen omeprazole (PRILOSEC) 40 MG capsule Take 40 mg by mouth 2 (two) times daily.        Allergies  Allergen Reactions  . Codeine Itching and Other (See Comments)    Severe anxiety  . Other Other (See Comments)    Steroids  "feels like choking"    History  Substance Use Topics  . Smoking status: Current Every Day Smoker -- 1.0 packs/day for 38 years  . Smokeless tobacco: Not on file  . Alcohol Use: No    No family history on file.   Review of Systems Constitutional: positive for weight loss, negative for chills, fevers and sweats Respiratory: positive for cough and oxygen use nightly Cardiovascular: negative Gastrointestinal: negative Neurological: negative  Objective:   Patient Vitals for the past 8 hrs:  BP Temp Temp src Pulse Resp SpO2 Height  05/21/12 1100 - - - - - - 5\' 5"  (1.651 m)  05/21/12 0947 111/66 mmHg 97.9 F (36.6 C) Oral 100  18  90 % -   BP 111/66  Pulse 100  Temp 97.9 F (36.6 C) (Oral)  Resp 18  Ht 5\' 5"  (1.651 m)  SpO2 90% General appearance: alert, cooperative, no distress and malnuorished Head: Normocephalic, without obvious abnormality, atraumatic Eyes: conjunctivae/corneas clear. PERRL, EOM's intact. Fundi benign. Lungs: diminished breath sounds on the right Heart: regular rate and rhythm Abdomen: soft, non-tender; bowel sounds normal; no masses,  no organomegaly Extremities: extremities normal, atraumatic, no cyanosis or edema Neurologic: Grossly normal   A/P:  1. Admit to inpatient 2. H/O Lung CA S/P Lobectomy ,Chronic Lung Infection on Right- ID consult placed for antibiotic recommendations 3. PICC line Placement 4. CT Scan Chest- pending 5. Appreciate pulmonary consult 6. Malnutrition- dietary consult, started Ensure 7. Anxiety- Xanax per home regimen 8. GERD- protonix 9. Dispo- complete workup, plan per Dr. Tyrone Sage  I have seen and examined [name] [name] and agree with the above  assessment  and plan.  Delight Ovens MD Beeper [phone] Office [phone] 05/22/2012 4:30 PM

## 2012-05-21 NOTE — Progress Notes (Signed)
[facility]  Patient Status: Active (receiving services up to time of hospitalization)  [facility] is providing the following services: RN  Please note from home care nurse:  Pt noncompliant with adm iv meds and removed PICC line herself without notifying md office or [facility].  Home situation questionable as pt reports her brother comes in strung out on crack and brings in strangers at all hours of the night  If patient discharges after hours, please call [phone].   Jodene Nam 05/21/2012, 6:15 PM

## 2012-05-22 ENCOUNTER — Encounter (HOSPITAL_COMMUNITY): Payer: Self-pay | Admitting: General Practice

## 2012-05-22 ENCOUNTER — Inpatient Hospital Stay (HOSPITAL_COMMUNITY): Payer: Medicaid Other

## 2012-05-22 DIAGNOSIS — J852 Abscess of lung without pneumonia: Secondary | ICD-10-CM

## 2012-05-22 LAB — APTT: aPTT: 34 seconds (ref 24–37)

## 2012-05-22 LAB — HEPATITIS PANEL, ACUTE
HCV Ab: NEGATIVE
Hep A IgM: NEGATIVE
Hep B C IgM: NEGATIVE
Hepatitis B Surface Ag: NEGATIVE

## 2012-05-22 LAB — COMPREHENSIVE METABOLIC PANEL
ALT: <5 U/L (ref 0–35)
AST: 8 U/L (ref 0–37)
Albumin: 2.2 g/dL — ABNORMAL LOW (ref 3.5–5.2)
Alkaline Phosphatase: 65 U/L (ref 39–117)
BUN: 8 mg/dL (ref 6–23)
CO2: 32 mEq/L (ref 19–32)
Calcium: 9.5 mg/dL (ref 8.4–10.5)
Chloride: 96 mEq/L (ref 96–112)
Creatinine, Ser: 0.43 mg/dL — ABNORMAL LOW (ref 0.50–1.10)
GFR calc Af Amer: >90 mL/min (ref >90)
GFR calc non Af Amer: >90 mL/min (ref >90)
Glucose, Bld: 119 mg/dL — ABNORMAL HIGH (ref 70–99)
Potassium: 4.3 mEq/L (ref 3.5–5.1)
Sodium: 135 mEq/L (ref 135–145)
Total Bilirubin: 0.1 mg/dL — ABNORMAL LOW (ref 0.3–1.2)
Total Protein: 7.3 g/dL (ref 6.0–8.3)

## 2012-05-22 LAB — CBC
HCT: 28.1 % — ABNORMAL LOW (ref 36.0–46.0)
Hemoglobin: 8.8 g/dL — ABNORMAL LOW (ref 12.0–15.0)
MCH: 28.9 pg (ref 26.0–34.0)
MCHC: 31.3 g/dL (ref 30.0–36.0)
MCV: 92.1 fL (ref 78.0–100.0)
Platelets: 331 10*3/uL (ref 150–400)
RBC: 3.05 MIL/uL — ABNORMAL LOW (ref 3.87–5.11)
RDW: 15.2 % (ref 11.5–15.5)
WBC: 11.7 10*3/uL — ABNORMAL HIGH (ref 4.0–10.5)

## 2012-05-22 LAB — URINALYSIS, ROUTINE W REFLEX MICROSCOPIC
Bilirubin Urine: NEGATIVE
Glucose, UA: NEGATIVE mg/dL
Ketones, ur: NEGATIVE mg/dL
Leukocytes, UA: NEGATIVE
Nitrite: NEGATIVE
Protein, ur: NEGATIVE mg/dL
Specific Gravity, Urine: 1.01 (ref 1.005–1.030)
Urobilinogen, UA: 0.2 mg/dL (ref 0.0–1.0)
pH: 6.5 (ref 5.0–8.0)

## 2012-05-22 LAB — BLOOD GAS, ARTERIAL
Acid-Base Excess: 8.3 mmol/L — ABNORMAL HIGH (ref 0.0–2.0)
Bicarbonate: 33.3 mEq/L — ABNORMAL HIGH (ref 20.0–24.0)
Drawn by: 31297
FIO2: 0.21 %
O2 Saturation: 64.5 %
Patient temperature: 98.6
TCO2: 35.1 mmol/L (ref 0–100)
pCO2 arterial: 56.2 mmHg — ABNORMAL HIGH (ref 35.0–45.0)
pH, Arterial: 7.39 (ref 7.350–7.450)
pO2, Arterial: 36.7 mmHg — CL (ref 80.0–100.0)

## 2012-05-22 LAB — URINE MICROSCOPIC-ADD ON

## 2012-05-22 LAB — PREALBUMIN: Prealbumin: 10.4 mg/dL — ABNORMAL LOW (ref 17.0–34.0)

## 2012-05-22 LAB — PROTIME-INR
INR: 0.98 (ref 0.00–1.49)
Prothrombin Time: 12.9 seconds (ref 11.6–15.2)

## 2012-05-22 LAB — ABO/RH: ABO/RH(D): O POS

## 2012-05-22 LAB — HIV ANTIBODY (ROUTINE TESTING W REFLEX): HIV: NONREACTIVE

## 2012-05-22 MED ORDER — GUAIFENESIN ER 600 MG PO TB12
600.0000 mg | ORAL_TABLET | Freq: Two times a day (BID) | ORAL | Status: DC
  Administered 2012-05-22 (×2): 600 mg via ORAL
  Filled 2012-05-22 (×4): qty 1

## 2012-05-22 MED ORDER — LEVALBUTEROL HCL 0.63 MG/3ML IN NEBU
0.6300 mg | INHALATION_SOLUTION | Freq: Three times a day (TID) | RESPIRATORY_TRACT | Status: DC
  Filled 2012-05-22 (×3): qty 3

## 2012-05-22 MED ORDER — IPRATROPIUM BROMIDE 0.02 % IN SOLN
0.5000 mg | Freq: Four times a day (QID) | RESPIRATORY_TRACT | Status: DC
  Administered 2012-05-22 – 2012-06-01 (×29): 0.5 mg via RESPIRATORY_TRACT
  Filled 2012-05-22 (×36): qty 2.5

## 2012-05-22 MED ORDER — VANCOMYCIN HCL IN DEXTROSE 1-5 GM/200ML-% IV SOLN
1000.0000 mg | INTRAVENOUS | Status: DC
  Administered 2012-05-22 – 2012-05-24 (×3): 1000 mg via INTRAVENOUS
  Filled 2012-05-22 (×4): qty 200

## 2012-05-22 MED ORDER — ALBUTEROL SULFATE (5 MG/ML) 0.5% IN NEBU
2.5000 mg | INHALATION_SOLUTION | Freq: Four times a day (QID) | RESPIRATORY_TRACT | Status: DC
  Administered 2012-05-22 – 2012-06-01 (×28): 2.5 mg via RESPIRATORY_TRACT
  Filled 2012-05-22 (×36): qty 0.5

## 2012-05-22 MED ORDER — LEVALBUTEROL HCL 0.63 MG/3ML IN NEBU
0.6300 mg | INHALATION_SOLUTION | Freq: Three times a day (TID) | RESPIRATORY_TRACT | Status: DC | PRN
  Administered 2012-05-27: 0.63 mg via RESPIRATORY_TRACT
  Filled 2012-05-22: qty 3

## 2012-05-22 NOTE — Progress Notes (Signed)
INFECTIOUS DISEASE PROGRESS NOTE  ID: [name] [name] is a 57 y.o. female with  Active Problems:  COPD (chronic obstructive pulmonary disease)  Subjective: C/o headache. "I'm ready for my surgery"  Abtx:  Anti-infectives     Start     Dose/Rate Route Frequency Ordered Stop   05/21/12 1400   imipenem-cilastatin (PRIMAXIN) 500 mg in sodium chloride 0.9 % 100 mL IVPB  Status:  Discontinued        500 mg 200 mL/hr over 30 Minutes Intravenous 3 times per day 05/21/12 1136 05/21/12 1141   05/21/12 1400   imipenem-cilastatin (PRIMAXIN) 250 mg in sodium chloride 0.9 % 100 mL IVPB        250 mg 200 mL/hr over 30 Minutes Intravenous 3 times per day 05/21/12 1141     05/21/12 1300   micafungin (MYCAMINE) 100 mg in sodium chloride 0.9 % 100 mL IVPB        100 mg 100 mL/hr over 1 Hours Intravenous Daily 05/21/12 1136            Medications:  Scheduled:   . ipratropium  0.5 mg Nebulization Q6H   And  . albuterol  2.5 mg Nebulization Q6H  . budesonide  0.5 mg Nebulization BID  . feeding supplement  237 mL Oral TID WC  . guaiFENesin  600 mg Oral BID  . imipenem-cilastatin  250 mg Intravenous Q8H  . levalbuterol  0.63 mg Nebulization Q8H  . micafungin (MYCAMINE) IV  100 mg Intravenous Daily  . multivitamin with minerals  1 tablet Oral Daily  . nicotine  14 mg Transdermal Daily  . pantoprazole  40 mg Oral Daily    Objective: Vital signs in last 24 hours: Temp:  [98 F (36.7 C)-98.8 F (37.1 C)] 98.2 F (36.8 C) (01/07 0506) Pulse Rate:  [79-101] 84  (01/07 0506) Resp:  [17-18] 17  (01/07 0506) BP: (98-118)/(65-76) 98/65 mmHg (01/07 0506) SpO2:  [90 %-92 %] 92 % (01/07 0506) Weight:  [40.1 kg (88 lb 6.5 oz)] 40.1 kg (88 lb 6.5 oz) (01/06 1100)   General appearance: alert, cooperative, no distress and anxious Resp: diminished breath sounds anterior - right Cardio: regular rate and rhythm GI: normal findings: bowel sounds normal and soft, non-tender  Lab  Results  Basename 05/21/12 1125  WBC 10.8*  HGB 8.9*  HCT 28.5*  NA 137  K 4.0  CL 97  CO2 32  BUN 6  CREATININE 0.37*  GLU --   Liver Panel  Basename 05/21/12 1125  PROT 7.5  ALBUMIN 2.2*  AST 11  ALT <5  ALKPHOS 67  BILITOT 0.1*  BILIDIR --  IBILI --   Sedimentation Rate No results found for this basename: ESRSEDRATE in the last 72 hours C-Reactive Protein No results found for this basename: CRP:2 in the last 72 hours  Microbiology: No results found for this or any previous visit (from the past 240 hour(s)).  Studies/Results: Ct Chest W Contrast  05/21/2012  *RADIOLOGY REPORT*  Clinical Data: Follow-up lung carcinoma.  Cough.  Short of breath.  CT CHEST WITH CONTRAST  Technique:  Multidetector CT imaging of the chest was performed following the standard protocol during bolus administration of intravenous contrast.  Contrast: 80mL OMNIPAQUE IOHEXOL 300 MG/ML  SOLN  Comparison: CT on 06/24/2011  Findings: Postop changes from previous right thoracotomy are stable in appearance.  A large cavitary lesion is again seen in the right upper hemithorax containing air fluid level. 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  There is a worsening diffuse collapse of the right lower lung with central air bronchograms and bronchiectasis.  Moderate left lung emphysema and compensatory hyperinflation is demonstrated.  No evidence of left lung and new 8 mm nodular density is seen in the superior and medial left lower lobe on image 28.  No evidence of left pleural effusion.  Mediastinal lymphadenopathy and right paratracheal region is mildly decreased in size, now measuring 2.3 x 2.6 cm compared to 2.3 x 3.0 cm. No new or increased lymphadenopathy seen within the thorax.  No evidence of suspicious bone lesions of chest wall mass.  IMPRESSION:  1.  Increased size of the large  cavitary lesion with air-fluid level in the superior right hemithorax. This could represent a hydropneumothorax secondary bronchopleural fistula, although intraparenchymal component cannot be excluded. 2.  Interval diffuse right lower lung collapse, with central air bronchograms and bronchiectasis. 3.  Left lung emphysema and indeterminate 8 mm nodule in the medial left lower lobe. Continued follow-up by CT is recommended. 4.  Slight decrease in right paratracheal mediastinal lymphadenopathy.   Original Report Authenticated By: Myles Rosenthal, M.D.      Assessment/Plan: Lung Abscess  Bipolar d/o  COPD  Total days of antibiotics 2 (mycfungin/vanco/imipenem)  No change in anbx Planning for surgery She asked me her chances of surviving the surgery and I said that they were good from an infectious perspective, but that I cannot answer from a COPD perspective. I let her know that she may on the vent for a prolonged period due to her COPD.         [name] Infectious Diseases [phone] 05/22/2012, 10:47 AM   LOS: 1 day

## 2012-05-22 NOTE — Progress Notes (Signed)
ANTIBIOTIC CONSULT NOTE - INITIAL  Pharmacy Consult for vancomycin Indication: lung abscess  Allergies  Allergen Reactions  . Codeine Itching and Other (See Comments)    Severe anxiety  . Other Swelling and Other (See Comments)    Steroids  "feels like choking"  . Spiriva (Tiotropium Bromide Monohydrate) Other (See Comments)    Patient does not like and states it doesn't work for her    Patient Measurements: Height: 5\' 5"  (165.1 cm) Weight: 88 lb 6.5 oz (40.1 kg) IBW/kg (Calculated) : 57   Vital Signs: Temp: 98.2 F (36.8 C) (01/07 0506) Temp src: Oral (01/07 0506) BP: 98/65 mmHg (01/07 0506) Pulse Rate: 84  (01/07 0506) Intake/Output from previous day: 01/06 0701 - 01/07 0700 In: 1740 [P.O.:1440; IV Piggyback:300] Out: 2600 [Urine:2600] Intake/Output from this shift: Total I/O In: 240 [P.O.:240] Out: -   Labs:  Basename 05/21/12 1125  WBC 10.8*  HGB 8.9*  PLT 317  LABCREA --  CREATININE 0.37*   Estimated Creatinine Clearance: 50.9 ml/min (by C-G formula based on Cr of 0.37). No results found for this basename: VANCOTROUGH:2,VANCOPEAK:2,VANCORANDOM:2,GENTTROUGH:2,GENTPEAK:2,GENTRANDOM:2,TOBRATROUGH:2,TOBRAPEAK:2,TOBRARND:2,AMIKACINPEAK:2,AMIKACINTROU:2,AMIKACIN:2, in the last 72 hours   Microbiology: Recent Results (from the past 720 hour(s))  CULTURE, RESPIRATORY     Status: Normal   Collection Time   05/01/12  2:12 PM      Component Value Range Status Comment   Specimen Description BRONCHIAL WASHINGS   Final    Special Requests NONE   Final    Gram Stain     Final    Value: ABUNDANT WBC PRESENT, PREDOMINANTLY PMN     NO SQUAMOUS EPITHELIAL CELLS SEEN     NO ORGANISMS SEEN   Culture Non-Pathogenic Oropharyngeal-type Flora Isolated.   Final    Report Status 05/04/2012 FINAL   Final   FUNGUS CULTURE W SMEAR     Status: Normal (Preliminary result)   Collection Time   05/01/12  2:12 PM      Component Value Range Status Comment   Specimen Description  BRONCHIAL WASHINGS   Final    Special Requests NONE   Final    Fungal Smear NO YEAST OR FUNGAL ELEMENTS SEEN   Final    Culture CANDIDA ALBICANS   Final    Report Status PENDING   Incomplete   AFB CULTURE WITH SMEAR     Status: Normal (Preliminary result)   Collection Time   05/01/12  2:12 PM      Component Value Range Status Comment   Specimen Description BRONCHIAL WASHINGS   Final    Special Requests NONE   Final    ACID FAST SMEAR NO ACID FAST BACILLI SEEN   Final    Culture     Final    Value: CULTURE WILL BE EXAMINED FOR 6 WEEKS BEFORE ISSUING A FINAL REPORT   Report Status PENDING   Incomplete     Medical History: Past Medical History  Diagnosis Date  . Depression with anxiety   . COPD (chronic obstructive pulmonary disease)   . Empyema   . GERD (gastroesophageal reflux disease)   . Lung cancer     RULobectomy 2001, stage I adenocarcinoma T2, N0, Mx    . H/O pulmonary fibrosis   . Tobacco use disorder   . Vocal cord paralysis   . Abnormal weight loss   . Pneumonia   . Pleural effusion   . Thyroid nodule     tiny on right   . Hydropneumothorax   . Normocytic anemia   .  Hemoptysis   . Shortness of breath     with exertion    Medications:  Prescriptions prior to admission  Medication Sig Dispense Refill  . albuterol (PROVENTIL HFA;VENTOLIN HFA) 108 (90 BASE) MCG/ACT inhaler Inhale 2 puffs into the lungs every 4 (four) hours as needed. For shortness of breath      . ALPRAZolam (XANAX) 1 MG tablet Take 1.5 mg by mouth 3 (three) times daily.       Marland Kitchen omeprazole (PRILOSEC) 40 MG capsule Take 40 mg by mouth 2 (two) times daily.        Assessment: 57 y/o female to begin vancomycin empirically for a lung abscess awaiting pneumonectomy. Renal function is normal at baseline, WBC are slightly elevated, and patient is afebrile. No microdata available.  Goal of Therapy:  Vancomycin trough level 15-20 mcg/ml  Plan:  -Vancomycin 1000 mg IV q24h -Follow-up renal function  and clinical course  [facility], [address].D., BCPS Clinical Pharmacist Pager: [phone] 05/22/2012 2:22 PM

## 2012-05-22 NOTE — Progress Notes (Addendum)
No new recommendations - see initial consult note 1/6  Exam unchanged, no rhonchi Ct pulmicort , she does not like spiriva- change to duonebs q6h standing instead D/w Dr gerhardt -  Completion  Pneumonectomy planned - this may be her only option but poor general status, ongoing infection & poor lung function do make her very high risk for vent dependence & death. PCCM will follow  ALVA,RAKESH V.

## 2012-05-22 NOTE — Progress Notes (Signed)
Patient ID: [name] [name], female   DOB: 06/18/1955, 57 y.o.   MRN: [id number] Since I have first seen the patient recommend 6 weeks ago she is continued to deteriorate, decreasing nutritional status. It appears her only chance of survival is to get control of the infection in her right chest, however any surgical intervention carries extremely high risk. I discussed this with her and told her there is a significant likelihood she could end up on long-term ventilator support, but without control of the chronic infection in her right chest she would likely not survive much longer. I discussed with her drainage, Eloesser flap, possible completion pneumonectomy Depending on the intraoperative findings.    The goals risks and alternatives of the planned surgical procedure Bronchoscopy, right video-assisted thoracoscopy probable thoracotomy possible lung resection possible Eloesser flap  have been discussed with the patient in detail. The risks of the procedure including death, infection, stroke, myocardial infarction, bleeding, blood transfusion long-term ventilator support have all been discussed specifically.  I have quoted [name] a 40 % of perioperative mortality and a complication rate as high as 75 %. The patient's questions have been answered.[name] [name] is willing  to proceed with the planned procedure.

## 2012-05-22 NOTE — Progress Notes (Addendum)
                   [address]            [place] [address]          [phone]         Subjective: Patient with complaints of productive cough  Objective: Vital signs in last 24 hours: Temp:  [97.9 F (36.6 C)-98.8 F (37.1 C)] 98.2 F (36.8 C) (01/07 0506) Pulse Rate:  [79-101] 84  (01/07 0506) Cardiac Rhythm:  [-] Normal sinus rhythm (01/06 1950) Resp:  [17-18] 17  (01/07 0506) BP: (98-118)/(65-76) 98/65 mmHg (01/07 0506) SpO2:  [90 %-92 %] 92 % (01/07 0506) Weight:  [40.1 kg (88 lb 6.5 oz)] 40.1 kg (88 lb 6.5 oz) (01/06 1100)    Intake/Output from previous day: 01/06 0701 - 01/07 0700 In: 1740 [P.O.:1440; IV Piggyback:300] Out: 2600 [Urine:2600]   Physical Exam:  Cardiovascular: Tachycardic. Pulmonary: Coarse breath sounds and diminished on right Abdomen: Soft, non tender, bowel sounds present. Extremities: No lower extremity edema.    Lab Results: CBC: Basename 05/21/12 1125  WBC 10.8*  HGB 8.9*  HCT 28.5*  PLT 317   BMET:  Basename 05/21/12 1125  NA 137  K 4.0  CL 97  CO2 32  GLUCOSE 163*  BUN 6  CREATININE 0.37*  CALCIUM 9.1    PT/INR:  Basename 05/21/12 1125  LABPROT 13.3  INR 1.02   ABG:  INR: Will add last result for INR, ABG once components are confirmed Will add last 4 CBG results once components are confirmed  Assessment/Plan:  1. CV - SR 2.  Pulmonary - CT of chest showed Increased size of the large cavitary lesion with air-fluid level in the superior right hemithorax. Interval diffuse right lower lung collapse, with central air bronchograms and bronchiectasis.Left lung emphysema and indeterminate 8 mm nodule in the medial  left lower lobe.Slight decrease in right paratracheal mediastinal  Lymphadenopathy. Dr. Tyrone Sage to discuss need for right lung surgery. 3.On Primaxin and Mycamine for lung abcess 4.Mucinex for cough     ZIMMERMAN,DONIELLE MPA-C 05/22/2012,7:26 AM    I have seen and examined  [name] and agree with the above assessment  and plan.  Delight Ovens MD Beeper [phone] Office [phone] 05/22/2012 4:30 PM

## 2012-05-22 NOTE — Care Management Note (Signed)
    Page 1 of 2   05/31/2012     1:55:43 PM   CARE MANAGEMENT NOTE 05/31/2012  Patient:  [name] [name],[name] [name]   Account Number:  [id number]  Date Initiated:  05/22/2012  Documentation initiated by:  Junius Creamer  Subjective/Objective Assessment:   adm w copd, lung abscess     Action/Plan:   lives w husband, pcp dr Onalee Hua [place], act w [facility]   Anticipated DC Date:  06/01/2012   Anticipated DC Plan:  SKILLED NURSING FACILITY  In-house referral  Clinical Social Worker      DC Planning Services  CM consult      [facility] Choice  Resumption Of Svcs/PTA Provider   Choice offered to / List presented to:          [facility] arranged  HH-1 RN      [facility] agency  [facility]   Status of service:  In process, will continue to follow Medicare Important Message given?   (If response is "NO", the following Medicare IM given date fields will be blank) Date Medicare IM given:   Date Additional Medicare IM given:    Discharge Disposition:  SKILLED NURSING FACILITY  Per UR Regulation:  Reviewed for med. necessity/level of care/duration of stay  If discussed at Long Length of Stay Meetings, dates discussed:   05/31/2012    Comments:  05/31/12- 1230-See Beharry Zenda Alpers RN, BSN [phone] Per CSW- pt is at this time agreeable to ST-SNF placement- Dr. Tyrone Sage aware- d/c plan- possible d/c 06/01/12 to SNF- CSW following for placement needs.  05/31/12- 1030- Donn Pierini RN, BSN [phone] Spoke with pt at bedside regarding d/c plans- pt wants to go home- but also states that she is agreeable to a facility she just doesn't want to stay "40 days"- discussed HH care options with pt and went over the fact that [facility] would not be there all the time nor would they come everyday- they would "teach" her and assist her in caring for her wound and IV abx at home. Pt states that she lives with her brother and that he could "help" her- not sure how much help the brother can provide. Pt's main concern seems to be the  facility taking her medicaid check- will have CSW come speak with pt again regarding ST-SNF options and how payment works-  Pt states that she has had [facility] for [facility] in the past and would like to use them again for [facility] at home- spoke with Lupita Leash with [facility]- regarding HH as an option- and per [facility] they could start [facility] services tomorrow and would have available staff if known today that pt was going home with [facility].  05/29/12= 1200- Donn Pierini RN, BSN [phone] Pt s/p VATS/Thor. 6 days- pt still on 02 weaning- d/c planning for SNF- CSW consulted for placement- pt is agreeable.   05/22/12 1343 debbie dowell rn,bsn

## 2012-05-23 ENCOUNTER — Inpatient Hospital Stay (HOSPITAL_COMMUNITY): Payer: Medicaid Other

## 2012-05-23 ENCOUNTER — Encounter (HOSPITAL_COMMUNITY): Payer: Self-pay | Admitting: Anesthesiology

## 2012-05-23 ENCOUNTER — Encounter (HOSPITAL_COMMUNITY)
Admission: AD | Disposition: A | Discharge status: Skilled Nursing Facility | Payer: Self-pay | Source: Ambulatory Visit | Attending: Cardiothoracic Surgery

## 2012-05-23 ENCOUNTER — Inpatient Hospital Stay (HOSPITAL_COMMUNITY): Payer: Medicaid Other | Admitting: Anesthesiology

## 2012-05-23 DIAGNOSIS — C3490 Malignant neoplasm of unspecified part of unspecified bronchus or lung: Secondary | ICD-10-CM

## 2012-05-23 DIAGNOSIS — J860 Pyothorax with fistula: Secondary | ICD-10-CM

## 2012-05-23 HISTORY — PX: THORACOTOMY: SHX5074

## 2012-05-23 HISTORY — PX: VIDEO ASSISTED THORACOSCOPY: SHX5073

## 2012-05-23 HISTORY — PX: ELOESSOR: SHX5098

## 2012-05-23 HISTORY — PX: VIDEO BRONCHOSCOPY: SHX5072

## 2012-05-23 LAB — GRAM STAIN

## 2012-05-23 LAB — SURGICAL PCR SCREEN
MRSA, PCR: NEGATIVE
Staphylococcus aureus: NEGATIVE

## 2012-05-23 SURGERY — BRONCHOSCOPY, VIDEO-ASSISTED
Anesthesia: General | Site: Chest | Laterality: Right | Wound class: Dirty or Infected

## 2012-05-23 MED ORDER — LACTATED RINGERS IV SOLN
INTRAVENOUS | Status: DC | PRN
  Administered 2012-05-23 (×2): via INTRAVENOUS

## 2012-05-23 MED ORDER — GUAIFENESIN ER 600 MG PO TB12
600.0000 mg | ORAL_TABLET | Freq: Two times a day (BID) | ORAL | Status: DC
  Administered 2012-05-24 – 2012-06-01 (×17): 600 mg via ORAL
  Filled 2012-05-23 (×21): qty 1

## 2012-05-23 MED ORDER — LIDOCAINE HCL (CARDIAC) 20 MG/ML IV SOLN
INTRAVENOUS | Status: DC | PRN
  Administered 2012-05-23: 70 mg via INTRAVENOUS

## 2012-05-23 MED ORDER — KCL IN DEXTROSE-NACL 20-5-0.9 MEQ/L-%-% IV SOLN
INTRAVENOUS | Status: DC
  Administered 2012-05-23 – 2012-05-28 (×3): via INTRAVENOUS
  Filled 2012-05-23 (×8): qty 1000

## 2012-05-23 MED ORDER — BISACODYL 5 MG PO TBEC
10.0000 mg | DELAYED_RELEASE_TABLET | Freq: Every day | ORAL | Status: DC
  Administered 2012-05-24 – 2012-06-01 (×10): 10 mg via ORAL
  Filled 2012-05-23 (×9): qty 2

## 2012-05-23 MED ORDER — FENTANYL CITRATE 0.05 MG/ML IJ SOLN
INTRAMUSCULAR | Status: DC | PRN
  Administered 2012-05-23: 50 ug via INTRAVENOUS
  Administered 2012-05-23 (×4): 25 ug via INTRAVENOUS

## 2012-05-23 MED ORDER — ARTIFICIAL TEARS OP OINT
TOPICAL_OINTMENT | OPHTHALMIC | Status: DC | PRN
  Administered 2012-05-23: 1 via OPHTHALMIC

## 2012-05-23 MED ORDER — POTASSIUM CHLORIDE 10 MEQ/50ML IV SOLN: 10.0000 meq | Freq: Every day | INTRAVENOUS | Status: DC | PRN

## 2012-05-23 MED ORDER — ADULT MULTIVITAMIN W/MINERALS CH
1.0000 | ORAL_TABLET | Freq: Every day | ORAL | Status: DC
  Administered 2012-05-24 – 2012-06-01 (×9): 1 via ORAL
  Filled 2012-05-23 (×9): qty 1

## 2012-05-23 MED ORDER — DEXTROSE 5 % IV SOLN
30.0000 ug/min | INTRAVENOUS | Status: DC
  Filled 2012-05-23: qty 1

## 2012-05-23 MED ORDER — PROPOFOL INFUSION 10 MG/ML OPTIME
INTRAVENOUS | Status: DC | PRN
  Administered 2012-05-23: 35 ug/kg/min via INTRAVENOUS

## 2012-05-23 MED ORDER — FENTANYL CITRATE 0.05 MG/ML IJ SOLN
25.0000 ug | INTRAMUSCULAR | Status: DC | PRN
  Administered 2012-05-23 – 2012-05-24 (×6): 25 ug via INTRAVENOUS
  Filled 2012-05-23 (×2): qty 2

## 2012-05-23 MED ORDER — PANTOPRAZOLE SODIUM 40 MG PO TBEC
40.0000 mg | DELAYED_RELEASE_TABLET | Freq: Every day | ORAL | Status: DC
  Administered 2012-05-24 – 2012-06-01 (×9): 40 mg via ORAL
  Filled 2012-05-23 (×9): qty 1

## 2012-05-23 MED ORDER — ROCURONIUM BROMIDE 100 MG/10ML IV SOLN
INTRAVENOUS | Status: DC | PRN
  Administered 2012-05-23: 400 mg via INTRAVENOUS

## 2012-05-23 MED ORDER — EPHEDRINE SULFATE 50 MG/ML IJ SOLN
INTRAMUSCULAR | Status: DC | PRN
  Administered 2012-05-23 (×2): 5 mg via INTRAVENOUS

## 2012-05-23 MED ORDER — TRAMADOL HCL 50 MG PO TABS
50.0000 mg | ORAL_TABLET | Freq: Four times a day (QID) | ORAL | Status: DC | PRN
  Administered 2012-05-24 – 2012-06-01 (×12): 100 mg via ORAL
  Filled 2012-05-23 (×12): qty 2

## 2012-05-23 MED ORDER — ALBUMIN HUMAN 5 % IV SOLN
INTRAVENOUS | Status: DC | PRN
  Administered 2012-05-23 (×2): via INTRAVENOUS

## 2012-05-23 MED ORDER — ACETAMINOPHEN 10 MG/ML IV SOLN
1000.0000 mg | Freq: Four times a day (QID) | INTRAVENOUS | Status: AC
  Administered 2012-05-23 – 2012-05-24 (×4): 1000 mg via INTRAVENOUS
  Filled 2012-05-23 (×4): qty 100

## 2012-05-23 MED ORDER — PROPOFOL 10 MG/ML IV BOLUS
INTRAVENOUS | Status: DC | PRN
  Administered 2012-05-23: 20 mg via INTRAVENOUS
  Administered 2012-05-23: 90 mg via INTRAVENOUS

## 2012-05-23 MED ORDER — ONDANSETRON HCL 4 MG/2ML IJ SOLN
4.0000 mg | Freq: Four times a day (QID) | INTRAMUSCULAR | Status: DC | PRN
  Administered 2012-05-23 – 2012-06-01 (×12): 4 mg via INTRAVENOUS
  Filled 2012-05-23 (×7): qty 2

## 2012-05-23 MED ORDER — PHENYLEPHRINE HCL 10 MG/ML IJ SOLN
INTRAMUSCULAR | Status: DC | PRN
  Administered 2012-05-23: 80 ug via INTRAVENOUS

## 2012-05-23 MED ORDER — PHENYLEPHRINE HCL 10 MG/ML IJ SOLN
10.0000 mg | INTRAVENOUS | Status: DC | PRN
  Administered 2012-05-23: 11:00:00 via INTRAVENOUS
  Administered 2012-05-23: 25 ug/min via INTRAVENOUS

## 2012-05-23 MED ORDER — SENNOSIDES-DOCUSATE SODIUM 8.6-50 MG PO TABS
1.0000 | ORAL_TABLET | Freq: Every evening | ORAL | Status: DC | PRN
  Administered 2012-05-27 – 2012-05-30 (×2): 1 via ORAL
  Filled 2012-05-23 (×2): qty 1

## 2012-05-23 MED ORDER — 0.9 % SODIUM CHLORIDE (POUR BTL) OPTIME
TOPICAL | Status: DC | PRN
  Administered 2012-05-23: 2000 mL

## 2012-05-23 MED ORDER — VECURONIUM BROMIDE 10 MG IV SOLR
INTRAVENOUS | Status: DC | PRN
  Administered 2012-05-23: 1 mg via INTRAVENOUS
  Administered 2012-05-23: 3 mg via INTRAVENOUS
  Administered 2012-05-23: 2 mg via INTRAVENOUS
  Administered 2012-05-23: 3 mg via INTRAVENOUS
  Administered 2012-05-23: 1 mg via INTRAVENOUS

## 2012-05-23 MED ORDER — MIDAZOLAM HCL 5 MG/5ML IJ SOLN
INTRAMUSCULAR | Status: DC | PRN
  Administered 2012-05-23 (×2): 1 mg via INTRAVENOUS

## 2012-05-23 SURGICAL SUPPLY — 74 items
APPLICATOR TIP COSEAL (VASCULAR PRODUCTS) IMPLANT
APPLICATOR TIP EXT COSEAL (VASCULAR PRODUCTS) IMPLANT
BRUSH CYTOL CELLEBRITY 1.5X140 (MISCELLANEOUS) IMPLANT
CANISTER SUCTION 2500CC (MISCELLANEOUS) ×8 IMPLANT
CATH KIT ON Q 5IN SLV (PAIN MANAGEMENT) IMPLANT
CATH THORACIC 28FR (CATHETERS) IMPLANT
CATH THORACIC 36FR (CATHETERS) IMPLANT
CATH THORACIC 36FR RT ANG (CATHETERS) IMPLANT
CLIP TI MEDIUM 24 (CLIP) ×4 IMPLANT
CLIP TI MEDIUM 6 (CLIP) IMPLANT
CLIP TI WIDE RED SMALL 24 (CLIP) ×4 IMPLANT
CLOTH BEACON ORANGE TIMEOUT ST (SAFETY) ×4 IMPLANT
CONT SPEC 4OZ CLIKSEAL STRL BL (MISCELLANEOUS) ×12 IMPLANT
COVER TABLE BACK 60X90 (DRAPES) ×4 IMPLANT
DRAPE LAPAROSCOPIC ABDOMINAL (DRAPES) ×4 IMPLANT
DRAPE SLUSH MACHINE 52X66 (DRAPES) ×4 IMPLANT
DRAPE WARM FLUID 44X44 (DRAPE) IMPLANT
DRILL BIT 7/64X5 (BIT) IMPLANT
DRSG PAD ABDOMINAL 8X10 ST (GAUZE/BANDAGES/DRESSINGS) ×4 IMPLANT
ELECT BLADE 4.0 EZ CLEAN MEGAD (MISCELLANEOUS) ×4
ELECT REM PT RETURN 9FT ADLT (ELECTROSURGICAL) ×4
ELECTRODE BLDE 4.0 EZ CLN MEGD (MISCELLANEOUS) ×3 IMPLANT
ELECTRODE REM PT RTRN 9FT ADLT (ELECTROSURGICAL) ×3 IMPLANT
FORCEPS BIOP RJ4 1.8 (CUTTING FORCEPS) ×4 IMPLANT
GLOVE BIO SURGEON STRL SZ 6.5 (GLOVE) ×16 IMPLANT
GLOVE BIO SURGEON STRL SZ7 (GLOVE) ×4 IMPLANT
GLOVE BIOGEL PI IND STRL 6.5 (GLOVE) ×3 IMPLANT
GLOVE BIOGEL PI IND STRL 7.0 (GLOVE) ×15 IMPLANT
GLOVE BIOGEL PI INDICATOR 6.5 (GLOVE) ×1
GLOVE BIOGEL PI INDICATOR 7.0 (GLOVE) ×5
GOWN STRL NON-REIN LRG LVL3 (GOWN DISPOSABLE) ×24 IMPLANT
KIT BASIN OR (CUSTOM PROCEDURE TRAY) ×4 IMPLANT
KIT ROOM TURNOVER OR (KITS) ×4 IMPLANT
KIT SUCTION CATH 14FR (SUCTIONS) ×4 IMPLANT
MARKER SKIN DUAL TIP RULER LAB (MISCELLANEOUS) ×4 IMPLANT
NEEDLE BIOPSY TRANSBRONCH 21G (NEEDLE) IMPLANT
NS IRRIG 1000ML POUR BTL (IV SOLUTION) ×20 IMPLANT
OIL SILICONE PENTAX (PARTS (SERVICE/REPAIRS)) ×4 IMPLANT
PACK CHEST (CUSTOM PROCEDURE TRAY) ×4 IMPLANT
PAD ARMBOARD 7.5X6 YLW CONV (MISCELLANEOUS) ×8 IMPLANT
SCISSORS LAP 5X35 DISP (ENDOMECHANICALS) ×4 IMPLANT
SEALANT PROGEL (MISCELLANEOUS) IMPLANT
SEALANT SURG COSEAL 4ML (VASCULAR PRODUCTS) IMPLANT
SEALANT SURG COSEAL 8ML (VASCULAR PRODUCTS) IMPLANT
SOLUTION ANTI FOG 6CC (MISCELLANEOUS) ×8 IMPLANT
SPONGE GAUZE 4X4 12PLY (GAUZE/BANDAGES/DRESSINGS) ×8 IMPLANT
STAPLER VISISTAT 35W (STAPLE) ×4 IMPLANT
SUT BONE WAX W31G (SUTURE) ×4 IMPLANT
SUT PROLENE 3 0 SH DA (SUTURE) ×4 IMPLANT
SUT PROLENE 4 0 RB 1 (SUTURE) ×2
SUT PROLENE 4-0 RB1 .5 CRCL 36 (SUTURE) ×6 IMPLANT
SUT SILK  1 MH (SUTURE) ×2
SUT SILK 1 MH (SUTURE) ×6 IMPLANT
SUT SILK 2 0SH CR/8 30 (SUTURE) ×4 IMPLANT
SUT SILK 3 0SH CR/8 30 (SUTURE) ×4 IMPLANT
SUT VIC AB 1 CTX 18 (SUTURE) ×4 IMPLANT
SUT VIC AB 1 CTX 36 (SUTURE)
SUT VIC AB 1 CTX36XBRD ANBCTR (SUTURE) IMPLANT
SUT VIC AB 2-0 CTX 36 (SUTURE) ×8 IMPLANT
SUT VIC AB 3-0 SH 18 (SUTURE) ×8 IMPLANT
SUT VIC AB 3-0 X1 27 (SUTURE) ×8 IMPLANT
SUT VICRYL 2 TP 1 (SUTURE) ×4 IMPLANT
SWAB COLLECTION DEVICE MRSA (MISCELLANEOUS) ×4 IMPLANT
SYR 20ML ECCENTRIC (SYRINGE) ×4 IMPLANT
SYSTEM SAHARA CHEST DRAIN ATS (WOUND CARE) ×4 IMPLANT
TAPE CLOTH SURG 4X10 WHT LF (GAUZE/BANDAGES/DRESSINGS) ×4 IMPLANT
TIP APPLICATOR SPRAY EXTEND 16 (VASCULAR PRODUCTS) IMPLANT
TOWEL OR 17X24 6PK STRL BLUE (TOWEL DISPOSABLE) ×8 IMPLANT
TOWEL OR 17X26 10 PK STRL BLUE (TOWEL DISPOSABLE) ×8 IMPLANT
TRAP SPECIMEN MUCOUS 40CC (MISCELLANEOUS) ×16 IMPLANT
TRAY FOLEY CATH 14FRSI W/METER (CATHETERS) ×4 IMPLANT
TUBE ANAEROBIC SPECIMEN COL (MISCELLANEOUS) ×4 IMPLANT
TUBE CONNECTING 12X1/4 (SUCTIONS) ×4 IMPLANT
WATER STERILE IRR 1000ML POUR (IV SOLUTION) ×8 IMPLANT

## 2012-05-23 NOTE — Anesthesia Preprocedure Evaluation (Addendum)
Anesthesia Evaluation  Patient identified by MRN, date of birth, ID band Patient awake    Reviewed: Allergy & Precautions, H&P , NPO status , Patient's Chart, lab work & pertinent test results  Airway Mallampati: II TM Distance: >3 FB Neck ROM: Full    Dental  (+) Dental Advisory Given and Teeth Intact   Pulmonary shortness of breath, COPD COPD inhaler, Current Smoker,  H/o pulmonary fibrosis, hx lung CA s/p right upper lobectomy, chronic pulmonary infection - 6-7 months on chronic abx therapy         Cardiovascular     Neuro/Psych PSYCHIATRIC DISORDERS Anxiety Depression    GI/Hepatic GERD-  ,  Endo/Other    Renal/GU      Musculoskeletal   Abdominal   Peds  Hematology  (+) Blood dyscrasia, anemia ,   Anesthesia Other Findings   Reproductive/Obstetrics                      Anesthesia Physical Anesthesia Plan  ASA: III  Anesthesia Plan: General   Post-op Pain Management:    Induction: Intravenous  Airway Management Planned: Double Lumen EBT  Additional Equipment:   Intra-op Plan:   Post-operative Plan: Extubation in OR and Possible Post-op intubation/ventilation  Informed Consent: I have reviewed the patients History and Physical, chart, labs and discussed the procedure including the risks, benefits and alternatives for the proposed anesthesia with the patient or authorized representative who has indicated his/her understanding and acceptance.     Plan Discussed with: Anesthesiologist and Surgeon  Anesthesia Plan Comments:        Anesthesia Quick Evaluation

## 2012-05-23 NOTE — Brief Op Note (Signed)
05/21/2012 - 05/23/2012  11:38 AM  PATIENT:  [name] [name]  57 y.o. female  PRE-OPERATIVE DIAGNOSIS:  Chronic Right Lung Infection  POST-OPERATIVE DIAGNOSIS:  Chronic Right Lung Infection  PROCEDURE:  VIDEO BRONCHOSCOPY, RIGHT VIDEO ASSISTED THORACOSCOPY , RIGHT THORACOTOMY, DEBRIDEMENT AND DRAINAGE of RIGHT LUNG CAVITY, and RIGHT ELOESSOR PROCEDURE    SURGEON:  Surgeon(s) and Role:    * Delight Ovens, MD - Primary  PHYSICIAN ASSISTANT: Doree Fudge PA-C  ANESTHESIA:   general  EBL:  Total I/O In: 1000 [I.V.:1000] Out: 475 [Urine:400; Blood:75]  BLOOD ADMINISTERED:none  SPECIMEN:  Source of Specimen:  Right rib (4th) and cultures of right lung cavity  DISPOSITION OF SPECIMEN:  PATHOLOGY  COUNTS CORRECT:  YES  DICTATION: .Dragon Dictation  PLAN OF CARE: Admit to inpatient   PATIENT DISPOSITION:  ICU - intubated and hemodynamically stable.   Delay start of Pharmacological VTE agent (>24hrs) due to surgical blood loss or risk of bleeding: yes

## 2012-05-23 NOTE — Significant Event (Addendum)
Vent mechanics look acceptable w/ F/Vt < 70 on PSV of 8. She is able to follow commands and hemodynamically should be able to tolerate extubation. Plan Extubate  Cont post-op care  Anders Simmonds ACNP-BC [facility] Pulmonary/Critical Care Pager # [phone] OR # [phone] if no answer  Coralyn Helling, MD [facility] Pulmonary/Critical Care 05/23/2012, 5:50 PM Pager:  [phone] After 3pm call: [phone]

## 2012-05-23 NOTE — Anesthesia Procedure Notes (Addendum)
Procedure Name: Intubation Date/Time: 05/23/2012 8:43 AM Performed by: Elon Alas Pre-anesthesia Checklist: Timeout performed, Patient identified, Emergency Drugs available, Suction available and Patient being monitored Patient Re-evaluated:Patient Re-evaluated prior to inductionOxygen Delivery Method: Circle system utilized Preoxygenation: Pre-oxygenation with 100% oxygen Intubation Type: IV induction Ventilation: Mask ventilation without difficulty Laryngoscope Size: Mac and 3 Grade View: Grade I Tube type: Oral Tube size: 8.5 mm Number of attempts: 1 Airway Equipment and Method: Stylet Placement Confirmation: positive ETCO2,  ETT inserted through vocal cords under direct vision and breath sounds checked- equal and bilateral Secured at: 21 cm Tube secured with: Tape Dental Injury: Teeth and Oropharynx as per pre-operative assessment     Procedure Name: Intubation Date/Time: 05/23/2012 9:17 AM Performed by: Elon Alas Pre-anesthesia Checklist: Patient identified, Timeout performed, Emergency Drugs available, Patient being monitored and Suction available Patient Re-evaluated:Patient Re-evaluated prior to inductionOxygen Delivery Method: Circle system utilized Preoxygenation: Pre-oxygenation with 100% oxygen Laryngoscope Size: Mac and 3 Grade View: Grade I Endobronchial tube: Double lumen EBT and 35 Fr Number of attempts: 1 Placement Confirmation: positive ETCO2,  breath sounds checked- equal and bilateral and ETT inserted through vocal cords under direct vision Tube secured with: Tape Dental Injury: Teeth and Oropharynx as per pre-operative assessment

## 2012-05-23 NOTE — Progress Notes (Signed)
INFECTIOUS DISEASE PROGRESS NOTE  ID: [name] [name] is a 57 y.o. female with   Active Problems:  COPD (chronic obstructive pulmonary disease)  Subjective: Extubated. uncomfortable  Abtx:  Anti-infectives     Start     Dose/Rate Route Frequency Ordered Stop   05/22/12 1500   vancomycin (VANCOCIN) IVPB 1000 mg/200 mL premix        1,000 mg 200 mL/hr over 60 Minutes Intravenous Every 24 hours 05/22/12 1413     05/21/12 1400   imipenem-cilastatin (PRIMAXIN) 500 mg in sodium chloride 0.9 % 100 mL IVPB  Status:  Discontinued        500 mg 200 mL/hr over 30 Minutes Intravenous 3 times per day 05/21/12 1136 05/21/12 1141   05/21/12 1400   imipenem-cilastatin (PRIMAXIN) 250 mg in sodium chloride 0.9 % 100 mL IVPB        250 mg 200 mL/hr over 30 Minutes Intravenous 3 times per day 05/21/12 1141     05/21/12 1300   micafungin (MYCAMINE) 100 mg in sodium chloride 0.9 % 100 mL IVPB        100 mg 100 mL/hr over 1 Hours Intravenous Daily 05/21/12 1136            Medications:  Scheduled:   . acetaminophen  1,000 mg Intravenous Q6H  . ipratropium  0.5 mg Nebulization Q6H   And  . albuterol  2.5 mg Nebulization Q6H  . bisacodyl  10 mg Oral Daily  . budesonide  0.5 mg Nebulization BID  . guaiFENesin  600 mg Oral BID  . imipenem-cilastatin  250 mg Intravenous Q8H  . micafungin (MYCAMINE) IV  100 mg Intravenous Daily  . multivitamin with minerals  1 tablet Oral Daily  . nicotine  14 mg Transdermal Daily  . pantoprazole  40 mg Oral Q1200  . vancomycin  1,000 mg Intravenous Q24H    Objective: Vital signs in last 24 hours: Temp:  [97.5 F (36.4 C)-98.9 F (37.2 C)] 98.9 F (37.2 C) (01/08 0736) Pulse Rate:  [82-89] 87  (01/08 1355) Resp:  [12-25] 25  (01/08 1355) BP: (91-122)/(51-69) 122/63 mmHg (01/08 1355) SpO2:  [85 %-100 %] 98 % (01/08 1413) Arterial Line BP: (129)/(73) 129/73 mmHg (01/08 1230) FiO2 (%):  [50 %] 50 % (01/08 1300)   General appearance: alert and mild  distress Neck: R neck IJ Resp: rhonchi bilaterally Cardio: regular rate and rhythm GI: normal findings: bowel sounds normal and soft, non-tender  Lab Results  Basename 05/22/12 1755 05/21/12 1125  WBC 11.7* 10.8*  HGB 8.8* 8.9*  HCT 28.1* 28.5*  NA 135 137  K 4.3 4.0  CL 96 97  CO2 32 32  BUN 8 6  CREATININE 0.43* 0.37*  GLU -- --   Liver Panel  Basename 05/22/12 1755 05/21/12 1125  PROT 7.3 7.5  ALBUMIN 2.2* 2.2*  AST 8 11  ALT <5 <5  ALKPHOS 65 67  BILITOT 0.1* 0.1*  BILIDIR -- --  IBILI -- --   Sedimentation Rate No results found for this basename: ESRSEDRATE in the last 72 hours C-Reactive Protein No results found for this basename: CRP:2 in the last 72 hours  Microbiology: Recent Results (from the past 240 hour(s))  SURGICAL PCR SCREEN     Status: Normal   Collection Time   05/22/12 11:14 PM      Component Value Range Status Comment   MRSA, PCR NEGATIVE  NEGATIVE Final    Staphylococcus aureus NEGATIVE  NEGATIVE Final  GRAM STAIN     Status: Normal   Collection Time   05/23/12  8:58 AM      Component Value Range Status Comment   Specimen Description BRONCHIAL WASHINGS   Final    Special Requests NONE   Final    Gram Stain     Final    Value: ABUNDANT WBC PRESENT, PREDOMINANTLY PMN     FEW GRAM POSITIVE COCCI IN PAIRS     CALLED TO Elby Showers RN 05/23/12 0935 COSTELLO B   Report Status 05/23/2012 FINAL   Final     Studies/Results: Dg Chest 2 View  05/22/2012  *RADIOLOGY REPORT*  Clinical Data: Right lung infection, preop  CHEST - 2 VIEW  Comparison: CT chest dated 05/22/2011  Findings: Stable appearance the right hemithorax with cavitary lesion and air-fluid level.  Associated postsurgical changes / surgical clips.  Emphysematous changes in the left lung.  Cardiomediastinal silhouette is unchanged.  Left arm PICC terminating at the cavoatrial junction.  IMPRESSION: Stable cavitary lesion with air-fluid level in the right hemithorax.   Original Report  Authenticated By: Charline Bills, M.D.    Dg Chest Portable 1 View  05/23/2012  *RADIOLOGY REPORT*  Clinical Data: Postoperative.  PORTABLE CHEST - 1 VIEW  Comparison: May 22, 2012.  Findings: Surgical staples are seen over the lateral right chest wall.  Extensive subcutaneous emphysema is seen bilaterally. Endotracheal tube is in grossly good position with tip approximately 2 cm above the carina.  Left subclavian catheter is unchanged in position.  There is interval placement of right internal jugular catheter with tip in expected position of the SVC. Nasogastric tube passes through esophagus into stomach.  Left lung is clear.  Significantly decreased amount of fluid seen in right hemithorax. It is difficult to assess for pneumothorax on the right due to overlying subcutaneous emphysema.  Large amount of opacity is noted in right lower lobe consistent with atelectasis.  IMPRESSION: Postoperative changes involving right lateral chest wall. Extensive subcutaneous emphysema is seen bilaterally.  Endotracheal tube in grossly good position. Significantly decreased amount of fluid seen in right hemithorax compared to prior exam.   Original Report Authenticated By: Lupita Raider.,  M.D.      Assessment/Plan:  Lung Abscess  Bipolar d/o  COPD  Total days of antibiotics 3 (mycfungin/vanco/imipenem) No change in anbx for now. Will await her new Cx's  Not clear if her BAL gram stain is true or respiratory flora. Await Cx  [name] Infectious Diseases [phone] 05/23/2012, 2:31 PM   LOS: 2 days

## 2012-05-23 NOTE — Progress Notes (Signed)
S/p eloesser  Sitting up in bed, comfortable  BP 99/60  Pulse 70  Temp 97.3 F (36.3 C) (Oral)  Resp 17  Ht 5\' 5"  (1.651 m)  Wt 88 lb 6.5 oz (40.1 kg)  BMI 14.71 kg/m2  SpO2 100%   Intake/Output Summary (Last 24 hours) at 05/23/12 1840 Last data filed at 05/23/12 1800  Gross per 24 hour  Intake 2627.67 ml  Output   4200 ml  Net -1572.33 ml    Continue current care

## 2012-05-23 NOTE — Progress Notes (Signed)
NUTRITION CONSULT/FOLLOW UP   DOCUMENTATION CODES  Per approved criteria   -Severe malnutrition in the context of chronic illness  -Underweight    Intervention:   If EN started, recommend Promote formula ---> initiate at 20 ml/hr and increase by 10 ml every 12 hours to goal rate of 50 ml/hr to provide 1200 kcals, 75 kcals, 1007 ml of free water Recommend monitoring Mg, Phos, K+ levels  RD to follow for nutrition care plan  New Nutrition Dx:   Inadequate oral intake related to inability to eat as evidenced by NPO status  Goal:   Oral intake vs nutrition support to meet >/= 90% of estimated nutrition needs  Monitor:   Nutrition support initiation, PO diet advancement, weight, labs, I/O's  Assessment:   Initial nutrition assessment completed 1/6.  Patient is currently intubated on ventilator support MV: 7.1 Temp: 37.2  Patient s/p procedures 1/8: VIDEO BRONCHOSCOPY, RIGHT  VIDEO ASSISTED THORACOSCOPY RIGHT THORACOTOMY  DEBRIDEMENT AND DRAINAGE of RIGHT LUNG CAVITY RIGHT ELOESSOR PROCEDURE   Height: Ht Readings from Last 1 Encounters:  05/21/12 5\' 5"  (1.651 m)    Weight Status:   Wt Readings from Last 1 Encounters:  05/21/12 88 lb 6.5 oz (40.1 kg)    Re-estimated needs:  Kcal: 1200-1300 Protein: 65-75 gm Fluid: > 1.5 L  Skin: chest surgical incision  Diet Order: NPO   Intake/Output Summary (Last 24 hours) at 05/23/12 1309 Last data filed at 05/23/12 1209  Gross per 24 hour  Intake   2484 ml  Output   3025 ml  Net   -541 ml    Last BM: 1/7   Labs:   Lab 05/22/12 1755 05/21/12 1125  NA 135 137  K 4.3 4.0  CL 96 97  CO2 32 32  BUN 8 6  CREATININE 0.43* 0.37*  CALCIUM 9.5 9.1  MG -- --  PHOS -- --  GLUCOSE 119* 163*    Prealbumin  Date Value Range Status  05/22/2012 10.4* 17.0 - 34.0 mg/dL Final    Scheduled Meds:   . acetaminophen  1,000 mg Intravenous Q6H  . ipratropium  0.5 mg Nebulization Q6H   And  . albuterol  2.5 mg  Nebulization Q6H  . bisacodyl  10 mg Oral Daily  . budesonide  0.5 mg Nebulization BID  . guaiFENesin  600 mg Oral BID  . imipenem-cilastatin  250 mg Intravenous Q8H  . micafungin (MYCAMINE) IV  100 mg Intravenous Daily  . multivitamin with minerals  1 tablet Oral Daily  . nicotine  14 mg Transdermal Daily  . pantoprazole  40 mg Oral Q1200  . vancomycin  1,000 mg Intravenous Q24H    Continuous Infusions:   . dextrose 5 % and 0.9 % NaCl with KCl 20 mEq/L      Kirkland Hun, RD, LDN Pager #: [phone] After-Hours Pager #: [phone]

## 2012-05-23 NOTE — Transfer of Care (Addendum)
Immediate Anesthesia Transfer of Care Note  Patient: [name] [name]  Procedure(s) Performed: Procedure(s) (LRB) with comments: VIDEO BRONCHOSCOPY (N/A) VIDEO ASSISTED THORACOSCOPY (Right) ELOESSOR PROCEDURE (Right) THORACOTOMY MAJOR (Right) - irrigation and debridment of right upper chest cavity  Patient Location: SICU  Anesthesia Type:General  Level of Consciousness: Patient remains intubated per anesthesia plan  Airway & Oxygen Therapy: Patient remains intubated per anesthesia plan and Patient placed on Ventilator (see vital sign flow sheet for setting)  Post-op Assessment: Report given to ICU RN, Post -op Vital signs reviewed and stable  Post vital signs: Reviewed and stable  Complications: No apparent anesthesia complications

## 2012-05-23 NOTE — Procedures (Signed)
Extubation Procedure Note  Patient Details:   Name: [name] [name] DOB: 1955/10/10 MRN: [id number]   Airway Documentation:     Evaluation  O2 sats: stable throughout and currently acceptable Complications: No apparent complications Patient did tolerate procedure well. Bilateral Breath Sounds: Clear   Yes Pt extubated per MD order, placed on 6L [place], sat 97%. Positive cuff leak. Pt unable to do  IS or flutter.  Md placed pt on cpap/ps 8/5 before RT came to room. Pt very sleepy.  [name] 05/23/2012, 2:08 PM

## 2012-05-23 NOTE — Consult Note (Signed)
PULMONARY  / CRITICAL CARE MEDICINE  Name: [name] [name] MRN: [id number] DOB: [birth year]    LOS: 2  REFERRING PROVIDER:   Tyrone Sage   CHIEF COMPLAINT:   Lung Abscess  BRIEF PATIENT DESCRIPTION:  57 year old active smoker w/ remote h/o lung CA. She is s/p RULobectomy~12 y ago. Since this time she has been treated at [facility] and [facility] for what was felt to be chronic pleural space infection. She presented to [facility] on 12/17 for elective bronchoscopy in setting of progressive wasting / cachexia which demonstrated no airway obstruction but significant pulmonary secretions.  She discharged AMA prior eval being completed.  Followed up in office with Dr. Tyrone Sage 1/2 and was re-admitted 1/6 for planned completion pneumonectomy / drainage of infected R pleural space.  12/13 FEV1 1.10 (39%) & DLCO 20%   LINES / TUBES: LUE PICC 1/6 >> oett 1/8>>>  CULTURES: AFB 12/17>>>no AFB>>> Fungus 12/17>>>neg>>> resp culture 12/17>>>abundant wbc's, non-pathogenic flora Wound gm stain 1/8: gpc pairs>>> Pleural space culture 1/8>>>  ANTIBIOTICS: mycafungin 1/6>>> vanc 1/6>>> Imipenem 1/6>>>  SIGNIFICANT EVENTS:  12/17 - Admit for bronch in setting of progressive wasting, neg for tumor but significant secretions 12/31 - LEFT AMA 1/6 -     Re-admit from CVTS office for further work up - pneumonectomy / drainage of pleural space infection S/P VATS w/ debridement and drainage of right lung cavity and Right Eloessor procedure 1/8  INTERVAL HISTORY:   Post op   VITAL SIGNS: Temp:  [97.5 F (36.4 C)-98.9 F (37.2 C)] 98.9 F (37.2 C) (01/08 0736) Pulse Rate:  [80-89] 88  (01/08 1230) Resp:  [12-20] 12  (01/08 1230) BP: (91-122)/(51-73) 122/51 mmHg (01/08 1215) SpO2:  [85 %-100 %] 100 % (01/08 1230) Arterial Line BP: (129)/(73) 129/73 mmHg (01/08 1230) FiO2 (%):  [50 %] 50 % (01/08 1230)  PHYSICAL EXAMINATION: General:  Frail, cachectic female, intubated  Neuro:  Awake and oriented  HEENT:  orally intubated  Cardiovascular:  rrr Lungs:  No wheeze, decreased on right, dressing w/ copious drainage  Abdomen:  Soft, non-tender  Musculoskeletal:  Intact Skin:  Intact    Lab 05/22/12 1755 05/21/12 1125  NA 135 137  K 4.3 4.0  CL 96 97  CO2 32 32  BUN 8 6  CREATININE 0.43* 0.37*  GLUCOSE 119* 163*    Lab 05/22/12 1755 05/21/12 1125  HGB 8.8* 8.9*  HCT 28.1* 28.5*  WBC 11.7* 10.8*  PLT 331 317   Dg Chest 2 View  05/22/2012  *RADIOLOGY REPORT*  Clinical Data: Right lung infection, preop  CHEST - 2 VIEW  Comparison: CT chest dated 05/22/2011  Findings: Stable appearance the right hemithorax with cavitary lesion and air-fluid level.  Associated postsurgical changes / surgical clips.  Emphysematous changes in the left lung.  Cardiomediastinal silhouette is unchanged.  Left arm PICC terminating at the cavoatrial junction.  IMPRESSION: Stable cavitary lesion with air-fluid level in the right hemithorax.   Original Report Authenticated By: Charline Bills, M.D.     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  2) S/P VATS w/ debridement and drainage of right lung cavity and Right Eloessor procedure 1/8 3) COPD (chronic obstructive pulmonary disease): very advanced w/ poor functional status, 4) Tobacco Abuse - continues to smoke 10 cigs per day 5) post-op Respiratory failure/ ventilatory dependence: currently awake, can f/c. Her right lung was likely  useless pre-op so don't think post-op changes will affect her respiratory ability at this point.  PLAN: Post-op care per thoracic surg Cont abx per ID Will go ahead w/ SBT Cont BDs If fails SBT will need sedation   Depression with anxiety Plan: Symptom support.   GERD (gastroesophageal reflux disease) Abnormal weight loss: due to #1 PLAN: -nutrition consult -ensure -protonix   BABCOCK,PETE 05/23/2012,  1:44 PM  Reviewed above, examined pt, and agree with assessment/plan.  Will try SBT and if okay, then plan for extubation later today.  Continue Abx per ID.  Post-op care per TCTS.  Continue bronchial hygiene and scheduled nebulizers.  CC time 40 minutes.  Coralyn Helling, MD [facility] Pulmonary/Critical Care 05/23/2012, 1:48 PM Pager:  [phone] After 3pm call: [phone]

## 2012-05-23 NOTE — Preoperative (Signed)
Beta Blockers   Reason not to administer Beta Blockers:Not Applicable 

## 2012-05-24 ENCOUNTER — Inpatient Hospital Stay (HOSPITAL_COMMUNITY): Payer: Medicaid Other

## 2012-05-24 LAB — URINALYSIS, ROUTINE W REFLEX MICROSCOPIC
Bilirubin Urine: NEGATIVE
Ketones, ur: NEGATIVE mg/dL
Nitrite: NEGATIVE
Protein, ur: NEGATIVE mg/dL
Urobilinogen, UA: 0.2 mg/dL (ref 0.0–1.0)
pH: 6 (ref 5.0–8.0)

## 2012-05-24 LAB — BASIC METABOLIC PANEL
BUN: 6 mg/dL (ref 6–23)
Calcium: 9.1 mg/dL (ref 8.4–10.5)
Creatinine, Ser: 0.32 mg/dL — ABNORMAL LOW (ref 0.50–1.10)
GFR calc Af Amer: >90 mL/min (ref >90)
GFR calc non Af Amer: >90 mL/min (ref >90)

## 2012-05-24 LAB — CBC
Hemoglobin: 7.7 g/dL — ABNORMAL LOW (ref 12.0–15.0)
MCHC: 31.8 g/dL (ref 30.0–36.0)
RDW: 15.2 % (ref 11.5–15.5)
WBC: 11.8 10*3/uL — ABNORMAL HIGH (ref 4.0–10.5)

## 2012-05-24 LAB — BLOOD GAS, ARTERIAL
Drawn by: 13898
O2 Content: 2 L/min
O2 Saturation: 95.5 %
Patient temperature: 98.6
pH, Arterial: 7.316 — ABNORMAL LOW (ref 7.350–7.450)

## 2012-05-24 MED ORDER — ONDANSETRON HCL 4 MG/2ML IJ SOLN
4.0000 mg | Freq: Four times a day (QID) | INTRAMUSCULAR | Status: DC | PRN
  Filled 2012-05-24 (×3): qty 2

## 2012-05-24 MED ORDER — NALOXONE HCL 0.4 MG/ML IJ SOLN: 0.4000 mg | INTRAMUSCULAR | Status: DC | PRN

## 2012-05-24 MED ORDER — ALPRAZOLAM 0.5 MG PO TABS
1.5000 mg | ORAL_TABLET | Freq: Three times a day (TID) | ORAL | Status: DC
  Administered 2012-05-24 – 2012-05-29 (×18): 1.5 mg via ORAL
  Filled 2012-05-24 (×19): qty 3

## 2012-05-24 MED ORDER — BOOST / RESOURCE BREEZE PO LIQD
1.0000 | Freq: Three times a day (TID) | ORAL | Status: DC
  Administered 2012-05-24 – 2012-05-27 (×8): 1 via ORAL
  Filled 2012-05-24 (×2): qty 1

## 2012-05-24 MED ORDER — ENSURE COMPLETE PO LIQD
237.0000 mL | Freq: Three times a day (TID) | ORAL | Status: DC
  Administered 2012-05-24: 237 mL via ORAL

## 2012-05-24 MED ORDER — SODIUM CHLORIDE 0.9 % IJ SOLN: 9.0000 mL | INTRAMUSCULAR | Status: DC | PRN

## 2012-05-24 MED ORDER — ENOXAPARIN SODIUM 40 MG/0.4ML ~~LOC~~ SOLN
20.0000 mg | SUBCUTANEOUS | Status: DC
  Administered 2012-05-25 – 2012-05-31 (×7): 20 mg via SUBCUTANEOUS
  Filled 2012-05-24 (×13): qty 0.4

## 2012-05-24 MED ORDER — ENOXAPARIN SODIUM 30 MG/0.3ML ~~LOC~~ SOLN
30.0000 mg | SUBCUTANEOUS | Status: DC
  Administered 2012-05-24: 30 mg via SUBCUTANEOUS
  Filled 2012-05-24: qty 0.3

## 2012-05-24 MED ORDER — FENTANYL 10 MCG/ML IV SOLN
INTRAVENOUS | Status: DC
  Administered 2012-05-24: 105 ug via INTRAVENOUS
  Administered 2012-05-24: 11:00:00 via INTRAVENOUS
  Administered 2012-05-24: 60 ug via INTRAVENOUS
  Administered 2012-05-25: 75 ug via INTRAVENOUS
  Administered 2012-05-25: 08:00:00 via INTRAVENOUS
  Administered 2012-05-25: 75 ug via INTRAVENOUS
  Administered 2012-05-25: 120 ug via INTRAVENOUS
  Administered 2012-05-25: 150 ug via INTRAVENOUS
  Administered 2012-05-25: 45 ug via INTRAVENOUS
  Administered 2012-05-25: 165 ug via INTRAVENOUS
  Administered 2012-05-26: 90 ug via INTRAVENOUS
  Administered 2012-05-26: 04:00:00 via INTRAVENOUS
  Administered 2012-05-26: 150 ug via INTRAVENOUS
  Administered 2012-05-26: 60 ug via INTRAVENOUS
  Administered 2012-05-26 (×2): 90 ug via INTRAVENOUS
  Administered 2012-05-26: 165 ug via INTRAVENOUS
  Administered 2012-05-26: 20:00:00 via INTRAVENOUS
  Administered 2012-05-27 (×2): 75 ug via INTRAVENOUS
  Administered 2012-05-27: 105 ug via INTRAVENOUS
  Administered 2012-05-27: 120 ug via INTRAVENOUS
  Administered 2012-05-27: 12:00:00 via INTRAVENOUS
  Administered 2012-05-27: 165 ug via INTRAVENOUS
  Administered 2012-05-27: 135 ug via INTRAVENOUS
  Administered 2012-05-28: 75 ug via INTRAVENOUS
  Administered 2012-05-28: 90 ug via INTRAVENOUS
  Administered 2012-05-28: 15 ug via INTRAVENOUS
  Filled 2012-05-24 (×6): qty 50

## 2012-05-24 MED ORDER — AMIODARONE HCL IN DEXTROSE 360-4.14 MG/200ML-% IV SOLN
INTRAVENOUS | Status: AC
  Filled 2012-05-24: qty 200

## 2012-05-24 MED ORDER — DIPHENHYDRAMINE HCL 50 MG/ML IJ SOLN
12.5000 mg | Freq: Four times a day (QID) | INTRAMUSCULAR | Status: DC | PRN
  Administered 2012-05-26: 12.5 mg via INTRAVENOUS
  Administered 2012-05-27: 25 mg via INTRAVENOUS
  Administered 2012-05-27 – 2012-05-28 (×3): 12.5 mg via INTRAVENOUS
  Filled 2012-05-24 (×4): qty 1

## 2012-05-24 MED ORDER — KETOROLAC TROMETHAMINE 30 MG/ML IJ SOLN
30.0000 mg | Freq: Four times a day (QID) | INTRAMUSCULAR | Status: AC | PRN
  Administered 2012-05-24 – 2012-05-28 (×8): 30 mg via INTRAVENOUS
  Filled 2012-05-24 (×9): qty 1

## 2012-05-24 MED ORDER — DIPHENHYDRAMINE HCL 12.5 MG/5ML PO ELIX
12.5000 mg | ORAL_SOLUTION | Freq: Four times a day (QID) | ORAL | Status: DC | PRN
  Filled 2012-05-24: qty 5

## 2012-05-24 MED ORDER — BUDESONIDE 0.5 MG/2ML IN SUSP
0.5000 mg | Freq: Two times a day (BID) | RESPIRATORY_TRACT | Status: DC
  Administered 2012-05-24 – 2012-06-01 (×12): 0.5 mg via RESPIRATORY_TRACT
  Filled 2012-05-24 (×19): qty 2

## 2012-05-24 MED ORDER — ENSURE PUDDING PO PUDG
1.0000 | Freq: Three times a day (TID) | ORAL | Status: DC
  Administered 2012-05-24: 1 via ORAL

## 2012-05-24 NOTE — Progress Notes (Signed)
PULMONARY  / CRITICAL CARE MEDICINE  Name: Shaun Zuccaro MRN: [id number] DOB: 10-16-1955    LOS: 3  REFERRING PROVIDER:   Tyrone Sage   CHIEF COMPLAINT:   Lung Abscess  BRIEF PATIENT DESCRIPTION:  57 year old active smoker w/ remote h/o lung CA. She is s/p RULobectomy~12 y ago. Since this time she has been treated at [facility] and [facility] for what was felt to be chronic pleural space infection. She presented to [facility] on 12/17 for elective bronchoscopy in setting of progressive wasting / cachexia which demonstrated no airway obstruction but significant pulmonary secretions.  She discharged AMA prior eval being completed.  Followed up in office with Dr. Tyrone Sage 1/2 and was re-admitted 1/6 for planned completion pneumonectomy / drainage of infected R pleural space.  12/13 FEV1 1.10 (39%) & DLCO 20%   LINES / TUBES: LUE PICC 1/6 >> oett 1/8>>>1/8  CULTURES: AFB 12/17>>>no AFB>>> Fungus 12/17>>>neg>>> resp culture 12/17>>>abundant wbc's, non-pathogenic flora Wound gm stain 1/8: gpc pairs>>> Pleural space culture 1/8>>>  ANTIBIOTICS: mycafungin 1/6>>> vanc 1/6>>> Imipenem 1/6>>>  SIGNIFICANT EVENTS:  12/17 - Admit for bronch in setting of progressive wasting, neg for tumor but significant secretions 12/31 - LEFT AMA 1/6 -     Re-admit from CVTS office for further work up - pneumonectomy / drainage of pleural space infection S/P VATS w/ debridement and drainage of right lung cavity and Right Eloessor procedure 1/8 1/8 extubated  INTERVAL HISTORY:   1/9 - doing well post extubatin from resp stand point but appears very anxious and tremulous. Asking if she is going home3   VITAL SIGNS: Temp:  [97.3 F (36.3 C)-98.7 F (37.1 C)] 98.7 F (37.1 C) (01/09 1540) Pulse Rate:  [63-92] 83  (01/09 1400) Resp:  [14-23] 19  (01/09 1400) BP: (84-115)/(50-73) 115/73 mmHg (01/09 1400) SpO2:  [93 %-100 %] 94 % (01/09 1404) Arterial Line BP: (93-127)/(38-62) 127/54 mmHg (01/09 1400)  PHYSICAL  EXAMINATION: General:  Frail, cachectic female, Sitting in chair. Psych: looks anxious Neuro:  Awake and oriented  HEENT: orally intubated  Cardiovascular:  rrr Lungs:  No wheeze, decreased on right, Abdomen:  Soft, non-tender  Musculoskeletal:  Intact Skin:  Intact    Lab 05/24/12 0412 05/22/12 1755 05/21/12 1125  NA 132* 135 137  K 4.2 4.3 4.0  CL 97 96 97  CO2 32 32 32  BUN 6 8 6   CREATININE 0.32* 0.43* 0.37*  GLUCOSE 128* 119* 163*    Lab 05/24/12 0412 05/22/12 1755 05/21/12 1125  HGB 7.7* 8.8* 8.9*  HCT 24.2* 28.1* 28.5*  WBC 11.8* 11.7* 10.8*  PLT 248 331 317   Dg Chest 2 View  05/22/2012  *RADIOLOGY REPORT*  Clinical Data: Right lung infection, preop  CHEST - 2 VIEW  Comparison: CT chest dated 05/22/2011  Findings: Stable appearance the right hemithorax with cavitary lesion and air-fluid level.  Associated postsurgical changes / surgical clips.  Emphysematous changes in the left lung.  Cardiomediastinal silhouette is unchanged.  Left arm PICC terminating at the cavoatrial junction.  IMPRESSION: Stable cavitary lesion with air-fluid level in the right hemithorax.   Original Report Authenticated By: Charline Bills, M.D.    Dg Chest Port 1 View  05/24/2012  *RADIOLOGY REPORT*  Clinical Data: Postop VATS  PORTABLE CHEST - 1 VIEW  Comparison: Portable chest x-ray of 05/23/2012  Findings: Considerable volume loss throughout the right hemithorax is again noted with pleural and parenchymal opacity at the right lung base.   There does appear  to be a right pneumothorax present. Chest wall subcutaneous air has not changed significantly.  The endotracheal tube and NG tube have been removed.  The left lung is relatively well aerated.  Left PICC line is unchanged.  IMPRESSION:  1.  No change in postoperative changes on the right with volume loss throughout the right hemithorax. 2.  Right pneumothorax. 3.  Endotracheal tube and NG tube removed.   Original Report Authenticated By: Dwyane Dee,  M.D.    Dg Chest Portable 1 View  05/23/2012  *RADIOLOGY REPORT*  Clinical Data: Postoperative.  PORTABLE CHEST - 1 VIEW  Comparison: May 22, 2012.  Findings: Surgical staples are seen over the lateral right chest wall.  Extensive subcutaneous emphysema is seen bilaterally. Endotracheal tube is in grossly good position with tip approximately 2 cm above the carina.  Left subclavian catheter is unchanged in position.  There is interval placement of right internal jugular catheter with tip in expected position of the SVC. Nasogastric tube passes through esophagus into stomach.  Left lung is clear.  Significantly decreased amount of fluid seen in right hemithorax. It is difficult to assess for pneumothorax on the right due to overlying subcutaneous emphysema.  Large amount of opacity is noted in right lower lobe consistent with atelectasis.  IMPRESSION: Postoperative changes involving right lateral chest wall. Extensive subcutaneous emphysema is seen bilaterally.  Endotracheal tube in grossly good position. Significantly decreased amount of fluid seen in right hemithorax compared to prior exam.   Original Report Authenticated By: Lupita Raider.,  M.D.     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  2) S/P VATS w/ debridement and drainage of right lung cavity and Right Eloessor procedure 1/8 3) COPD (chronic obstructive pulmonary disease): very advanced w/ poor functional status, 4) Tobacco Abuse - continues to smoke 10 cigs per day 5) post-op Respiratory failure/ ventilatory dependence: currently awake, can f/c. Her right lung was likely useless pre-op so don't think post-op changes will affect her respiratory ability at this point.   On 55/9/13: doing well post extubation. Anxiety seems big issues  PLAN: Change xanax prn to scheduled tid home dose Post-op care per thoracic  surg Cont abx per ID Cont BDs    Depression with anxiety Plan: Symptom support; change xanax prn to scheduled  GERD (gastroesophageal reflux disease) Abnormal weight loss: due to #1 PLAN: -nutrition consult -ensure -protonix   Dr. Kalman Shan, M.D., [facility] Pulmonary and Critical Care Medicine Staff Physician [facility] Pulmonary and Critical Care Pager: [phone], If no answer or between  15:00h - 7:00h: call [phone]  05/24/2012 5:27 PM

## 2012-05-24 NOTE — Progress Notes (Signed)
Patient ID: [name] [name], female   DOB: Jan 17, 1956, 57 y.o.   MRN: [id number]  SICU Evening Rounds:  Hemodynamically stable  Ambulated today.  Up in chair, comfortable.

## 2012-05-24 NOTE — Op Note (Signed)
NAMESHIVA, KARIS NO.:  [id number]  MEDICAL RECORD NO.:  [id number]  LOCATION:  2303                         FACILITY:  [facility]  PHYSICIAN:  Sheliah Plane, MD    DATE OF BIRTH:  [birth year]  DATE OF PROCEDURE:  05/23/2012 DATE OF DISCHARGE:                              OPERATIVE REPORT   PREOPERATIVE DIAGNOSIS:  Right chronic empyema, status post right upper lobectomy.  POSTOPERATIVE DIAGNOSIS:  Right chronic empyema, status post right upper lobectomy.  SURGICAL PROCEDURE:  Bronchoscopy, right video-assisted thoracoscopy, right thoracotomy, drainage of empyema, and creation of Elosser flap.  SURGEON:  Sheliah Plane, MD.  FIRST ASSISTANT:  Doree Fudge, PA.  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  Since early summer of 2013, she has had a steady downhill course on long-term antibiotics through a PICC line. She had been evaluated in [facility] for surgical treatment and in [place] and ultimately, no treatment was performed and she continued to decline and was seen in mid December here.  Bronchoscopy at that time showed copious pulmonary secretions, purulent material, a large space in the right upper lung zone, and non aerating lower lobe.  Various treatment options were discussed with her.  At that time, she signed out AMA.  She returned this week with continued deterioration, cachexia, and was willing to proceed.  She was admitted.  Repeat CT scan was performed.  Pulmonary and Infectious Disease consults were obtained. She agreed to surgical procedure to drain the infection and potential completion pneumonectomy.  Family is aware of her poor medical condition and higher risks involved with any intervention.  DESCRIPTION OF PROCEDURE:  The patient underwent general endotracheal anesthesia with a single-lumen  endotracheal tube.  Time-out was performed.  Using 2.8 mm video scope, the bronchoscopy was performed. Again, as in December, there was copious white creamy secretions throughout the tracheobronchial tree, especially on the right.  Further suctioning and clearing of this tear was performed, and cultures and Gram stains obtained.  The area at the right upper lobe bronchial stump again appeared slightly inflamed.  Further biopsies of this area were obtained.  The scope was removed.  A double-lumen endotracheal tube was placed.  The patient was turned in lateral decubitus position.  Again, even with a previous bronchoscopy and suctioning of secretions, she continued to have significant secretions coming from both lumens of the endotracheal tube.  The right chest was prepped with Betadine and draped in sterile manner.  We started with dissecting out a small port site superiorly; however, the lung was densely adherent to the chest wall. So, a small thoracotomy incision through the old incision site anteriorly was performed.  The lower lobe was densely adherent, but as we worked posteriorly, we were able to enter a large empyema space posteriorly.  At this point, a degree of adhesions was not felt technically.  She would survive a completion pneumonectomy and decided to proceed with main goal of draining the large posterior space and it was full of purulent material.  Thus, with the video scope, the space  was explored, and biopsies were obtained.  Aerobic and anaerobic cultures were obtained.  We then proceeded with creation of an Elosser flap posteriorly at the lower end of cavity.  A separate skin incision was made with a skin flap superiorly.  Short segment of rib was removed, and the skin flap tacked inside of the space.  The cavity was copiously irrigated with saline.  Moist dressings were placed at the Elosser flap site.  The small anterior thoracotomy incision was then  closed. Interrupted 0 Vicryl pericostal sutures were placed.  The muscle layers were closed with interrupted 0 Vicryl, running 2-0 Vicryl in the subcutaneous tissue, and skin staples in the skin edges.  Because of the degree of secretions that had been present, it was decided not to extubate the patient and operate, and she was transferred directly to the Surgical Intensive Care Unit for further postoperative care.  Blood loss was minimal.  Sponge and needle count was reported as correct at completion of procedure.     Sheliah Plane, MD     EG/MEDQ  D:  05/24/2012  T:  05/24/2012  Job:  [id number]  cc:   Dineen Kid. Reche Dixon, M.D.

## 2012-05-24 NOTE — Progress Notes (Addendum)
NUTRITION CONSULT/FOLLOW UP   DOCUMENTATION CODES  Per approved criteria   -Severe malnutrition in the context of chronic illness  -Underweight    Intervention:   Resource Breeze 3 times daily between meals (250 kcals, 9 gm protein per 8 fl oz carton) RD to follow for nutrition care plan  New Nutrition Dx:   Increased nutrient needs related to post-op healing as evidenced by estimated nutrition needs, ongoing  Goal:   Oral intake with meals & supplements to meet >/= 90% of estimated nutrition needs, currently unmet  Monitor:   PO & supplemental intake, weight, labs, I/O's  Assessment:   Initial nutrition assessment completed 1/6.  Patient s/p procedures 1/8: VIDEO BRONCHOSCOPY, RIGHT  VIDEO ASSISTED THORACOSCOPY RIGHT THORACOTOMY  DEBRIDEMENT AND DRAINAGE of RIGHT LUNG CAVITY RIGHT ELOESSOR PROCEDURE   Patient extubated 1/8.  Patient tolerating Clear Liquids; amenable to [employer] supplement -- RD to order.  Height: Ht Readings from Last 1 Encounters:  05/21/12 5\' 5"  (1.651 m)    Weight Status:   Wt Readings from Last 1 Encounters:  05/21/12 88 lb 6.5 oz (40.1 kg)    Re-estimated needs:  Kcal: 1400-1600 Protein: 65-75 gm Fluid: > 1.5 L  Skin: chest surgical incision  Diet Order: Clear Liquid   Intake/Output Summary (Last 24 hours) at 05/24/12 1018 Last data filed at 05/24/12 0700  Gross per 24 hour  Intake 2923.67 ml  Output   2460 ml  Net 463.67 ml    Last BM: 1/7   Labs:   Lab 05/24/12 0412 05/22/12 1755 05/21/12 1125  NA 132* 135 137  K 4.2 4.3 4.0  CL 97 96 97  CO2 32 32 32  BUN 6 8 6   CREATININE 0.32* 0.43* 0.37*  CALCIUM 9.1 9.5 9.1  MG -- -- --  PHOS -- -- --  GLUCOSE 128* 119* 163*    Prealbumin  Date Value Range Status  05/22/2012 10.4* 17.0 - 34.0 mg/dL Final    Scheduled Meds:    . ipratropium  0.5 mg Nebulization Q6H   And  . albuterol  2.5 mg Nebulization Q6H  . ALPRAZolam  1.5 mg Oral TID  . bisacodyl  10 mg  Oral Daily  . budesonide  0.5 mg Nebulization BID  . enoxaparin  30 mg Subcutaneous Q24H  . feeding supplement  237 mL Oral TID WC  . feeding supplement  1 Container Oral TID BM  . fentaNYL   Intravenous Q4H  . guaiFENesin  600 mg Oral BID  . imipenem-cilastatin  250 mg Intravenous Q8H  . micafungin (MYCAMINE) IV  100 mg Intravenous Daily  . multivitamin with minerals  1 tablet Oral Daily  . nicotine  14 mg Transdermal Daily  . pantoprazole  40 mg Oral Q1200  . vancomycin  1,000 mg Intravenous Q24H    Continuous Infusions:    . dextrose 5 % and 0.9 % NaCl with KCl 20 mEq/L 100 mL/hr at 05/23/12 1547    Maureen Chatters, RD, LDN Pager #: [phone] After-Hours Pager #: [phone]

## 2012-05-24 NOTE — Progress Notes (Signed)
INFECTIOUS DISEASE PROGRESS NOTE  ID: [name] [name] is a 57 y.o. female with   Active Problems:  COPD (chronic obstructive pulmonary disease)  Subjective: Feels better  Abtx:  Anti-infectives     Start     Dose/Rate Route Frequency Ordered Stop   05/22/12 1500   vancomycin (VANCOCIN) IVPB 1000 mg/200 mL premix        1,000 mg 200 mL/hr over 60 Minutes Intravenous Every 24 hours 05/22/12 1413     05/21/12 1400   imipenem-cilastatin (PRIMAXIN) 500 mg in sodium chloride 0.9 % 100 mL IVPB  Status:  Discontinued        500 mg 200 mL/hr over 30 Minutes Intravenous 3 times per day 05/21/12 1136 05/21/12 1141   05/21/12 1400   imipenem-cilastatin (PRIMAXIN) 250 mg in sodium chloride 0.9 % 100 mL IVPB        250 mg 200 mL/hr over 30 Minutes Intravenous 3 times per day 05/21/12 1141     05/21/12 1300   micafungin (MYCAMINE) 100 mg in sodium chloride 0.9 % 100 mL IVPB        100 mg 100 mL/hr over 1 Hours Intravenous Daily 05/21/12 1136            Medications:  Scheduled:   . ipratropium  0.5 mg Nebulization Q6H   And  . albuterol  2.5 mg Nebulization Q6H  . ALPRAZolam  1.5 mg Oral TID  . bisacodyl  10 mg Oral Daily  . budesonide  0.5 mg Nebulization BID  . enoxaparin  30 mg Subcutaneous Q24H  . feeding supplement  237 mL Oral TID WC  . feeding supplement  1 Container Oral TID BM  . fentaNYL   Intravenous Q4H  . guaiFENesin  600 mg Oral BID  . imipenem-cilastatin  250 mg Intravenous Q8H  . micafungin (MYCAMINE) IV  100 mg Intravenous Daily  . multivitamin with minerals  1 tablet Oral Daily  . nicotine  14 mg Transdermal Daily  . pantoprazole  40 mg Oral Q1200  . vancomycin  1,000 mg Intravenous Q24H    Objective: Vital signs in last 24 hours: Temp:  [97.3 F (36.3 C)-98.2 F (36.8 C)] 98.2 F (36.8 C) (01/09 0748) Pulse Rate:  [63-94] 70  (01/09 0700) Resp:  [12-26] 18  (01/09 0700) BP: (84-124)/(50-74) 98/54 mmHg (01/09 0700) SpO2:  [88 %-100 %] 94 % (01/09  0922) Arterial Line BP: (93-158)/(38-77) 100/38 mmHg (01/09 0700) FiO2 (%):  [50 %] 50 % (01/08 1300)   General appearance: alert, cooperative and no distress Neck: R IJ Resp: rhonchi bilaterally Cardio: regular rate and rhythm GI: normal findings: bowel sounds normal and soft, non-tender  Lab Results  Basename 05/24/12 0412 05/22/12 1755  WBC 11.8* 11.7*  HGB 7.7* 8.8*  HCT 24.2* 28.1*  NA 132* 135  K 4.2 4.3  CL 97 96  CO2 32 32  BUN 6 8  CREATININE 0.32* 0.43*  GLU -- --   Liver Panel  Basename 05/22/12 1755 05/21/12 1125  PROT 7.3 7.5  ALBUMIN 2.2* 2.2*  AST 8 11  ALT <5 <5  ALKPHOS 65 67  BILITOT 0.1* 0.1*  BILIDIR -- --  IBILI -- --   Sedimentation Rate No results found for this basename: ESRSEDRATE in the last 72 hours C-Reactive Protein No results found for this basename: CRP:2 in the last 72 hours  Microbiology: Recent Results (from the past 240 hour(s))  SURGICAL PCR SCREEN     Status: Normal  Collection Time   05/22/12 11:14 PM      Component Value Range Status Comment   MRSA, PCR NEGATIVE  NEGATIVE Final    Staphylococcus aureus NEGATIVE  NEGATIVE Final   FUNGUS CULTURE W SMEAR     Status: Normal (Preliminary result)   Collection Time   05/23/12  8:58 AM      Component Value Range Status Comment   Specimen Description BRONCHIAL WASHINGS   Final    Special Requests NONE   Final    Fungal Smear NO YEAST OR FUNGAL ELEMENTS SEEN   Final    Culture CULTURE IN PROGRESS FOR FOUR WEEKS   Final    Report Status PENDING   Incomplete   GRAM STAIN     Status: Normal   Collection Time   05/23/12  8:58 AM      Component Value Range Status Comment   Specimen Description BRONCHIAL WASHINGS   Final    Special Requests NONE   Final    Gram Stain     Final    Value: ABUNDANT WBC PRESENT, PREDOMINANTLY PMN     FEW GRAM POSITIVE COCCI IN PAIRS     CALLED TO Andrey Campanile D RN 05/23/12 0935 COSTELLO B   Report Status 05/23/2012 FINAL   Final   CULTURE, RESPIRATORY      Status: Normal (Preliminary result)   Collection Time   05/23/12  8:58 AM      Component Value Range Status Comment   Specimen Description BRONCHIAL WASHINGS   Final    Special Requests NONE RIGHT LUNG   Final    Gram Stain     Final    Value: ABUNDANT WBC PRESENT, PREDOMINANTLY PMN     FEW GRAM POSITIVE COCCI IN PAIRS     Gram Stain Report Called to,Read Back By and Verified With: Gram Stain Report Called to,Read Back By and Verified With:  WILSON D RN @09 :36 ON 05/23/12 BY COSTELLO B   Culture NO GROWTH 1 DAY   Final    Report Status PENDING   Incomplete   WOUND CULTURE     Status: Normal (Preliminary result)   Collection Time   05/23/12 10:39 AM      Component Value Range Status Comment   Specimen Description WOUND RIGHT CHEST   Final    Special Requests SWAB OF THE UPPER CHEST CAVITY    Final    Gram Stain     Final    Value: ABUNDANT WBC PRESENT,BOTH PMN AND MONONUCLEAR     RARE SQUAMOUS EPITHELIAL CELLS PRESENT     RARE GRAM POSITIVE COCCI IN PAIRS   Culture NO GROWTH 1 DAY   Final    Report Status PENDING   Incomplete   ANAEROBIC CULTURE     Status: Normal (Preliminary result)   Collection Time   05/23/12 10:39 AM      Component Value Range Status Comment   Specimen Description WOUND RIGHT CHEST   Final    Special Requests SWAB OF THE UPPER CHEST CAVITY   Final    Gram Stain     Final    Value: ABUNDANT WBC PRESENT,BOTH PMN AND MONONUCLEAR     RARE SQUAMOUS EPITHELIAL CELLS PRESENT     RARE GRAM POSITIVE COCCI IN PAIRS   Culture     Final    Value: NO ANAEROBES ISOLATED; CULTURE IN PROGRESS FOR 5 DAYS   Report Status PENDING   Incomplete     Studies/Results: Dg Chest  2 View  05/22/2012  *RADIOLOGY REPORT*  Clinical Data: Right lung infection, preop  CHEST - 2 VIEW  Comparison: CT chest dated 05/22/2011  Findings: Stable appearance the right hemithorax with cavitary lesion and air-fluid level.  Associated postsurgical changes / surgical clips.  Emphysematous changes in the left  lung.  Cardiomediastinal silhouette is unchanged.  Left arm PICC terminating at the cavoatrial junction.  IMPRESSION: Stable cavitary lesion with air-fluid level in the right hemithorax.   Original Report Authenticated By: Charline Bills, M.D.    Dg Chest Port 1 View  05/24/2012  *RADIOLOGY REPORT*  Clinical Data: Postop VATS  PORTABLE CHEST - 1 VIEW  Comparison: Portable chest x-ray of 05/23/2012  Findings: Considerable volume loss throughout the right hemithorax is again noted with pleural and parenchymal opacity at the right lung base.   There does appear to be a right pneumothorax present. Chest wall subcutaneous air has not changed significantly.  The endotracheal tube and NG tube have been removed.  The left lung is relatively well aerated.  Left PICC line is unchanged.  IMPRESSION:  1.  No change in postoperative changes on the right with volume loss throughout the right hemithorax. 2.  Right pneumothorax. 3.  Endotracheal tube and NG tube removed.   Original Report Authenticated By: Dwyane Dee, M.D.    Dg Chest Portable 1 View  05/23/2012  *RADIOLOGY REPORT*  Clinical Data: Postoperative.  PORTABLE CHEST - 1 VIEW  Comparison: May 22, 2012.  Findings: Surgical staples are seen over the lateral right chest wall.  Extensive subcutaneous emphysema is seen bilaterally. Endotracheal tube is in grossly good position with tip approximately 2 cm above the carina.  Left subclavian catheter is unchanged in position.  There is interval placement of right internal jugular catheter with tip in expected position of the SVC. Nasogastric tube passes through esophagus into stomach.  Left lung is clear.  Significantly decreased amount of fluid seen in right hemithorax. It is difficult to assess for pneumothorax on the right due to overlying subcutaneous emphysema.  Large amount of opacity is noted in right lower lobe consistent with atelectasis.  IMPRESSION: Postoperative changes involving right lateral chest wall.  Extensive subcutaneous emphysema is seen bilaterally.  Endotracheal tube in grossly good position. Significantly decreased amount of fluid seen in right hemithorax compared to prior exam.   Original Report Authenticated By: Lupita Raider.,  M.D.      Assessment/Plan:  Lung Abscess  Bipolar d/o  COPD  Total days of antibiotics 4 (mycfungin/vanco/imipenem)  No change in anbx for now. Will await her new Cx's  She'd like to get to a different room to get a phone.    [name] Infectious Diseases [phone] 05/24/2012, 10:53 AM   LOS: 3 days

## 2012-05-24 NOTE — Progress Notes (Addendum)
1 Day Post-Op Procedure(s) (LRB): VIDEO BRONCHOSCOPY (N/A) VIDEO ASSISTED THORACOSCOPY (Right) ELOESSOR PROCEDURE (Right) THORACOTOMY MAJOR (Right) Subjective:  Ms. Varricchio complains of pain and urinary discomfort this morning.  She states they took her foley out last night, but she was unable to pee, it was replaced and she states now that it feels like its full.  The patient also complains of pain which she states is not very well controlled.  Objective: Vital signs in last 24 hours: Temp:  [97.3 F (36.3 C)-98.2 F (36.8 C)] 98.2 F (36.8 C) (01/09 0748) Pulse Rate:  [63-94] 70  (01/09 0700) Cardiac Rhythm:  [-] Normal sinus rhythm (01/09 0400) Resp:  [12-26] 18  (01/09 0700) BP: (84-124)/(50-74) 98/54 mmHg (01/09 0700) SpO2:  [88 %-100 %] 98 % (01/09 0759) Arterial Line BP: (93-158)/(38-77) 100/38 mmHg (01/09 0700) FiO2 (%):  [50 %] 50 % (01/08 1300)  Intake/Output from previous day: 01/08 0701 - 01/09 0700 In: 3723.7 [I.V.:2421.7; IV Piggyback:1302] Out: 2760 [Urine:2685; Blood:75]  General appearance: alert, cooperative and no distress Heart: regular rate and rhythm Lungs: diminished breath sounds on right Abdomen: soft, non-tender; bowel sounds normal; no masses,  no organomegaly Wound: clean and dry  Lab Results:  [facility] 05/24/12 0412 05/22/12 1755  WBC 11.8* 11.7*  HGB 7.7* 8.8*  HCT 24.2* 28.1*  PLT 248 331   BMET:  Basename 05/24/12 0412 05/22/12 1755  NA 132* 135  K 4.2 4.3  CL 97 96  CO2 32 32  GLUCOSE 128* 119*  BUN 6 8  CREATININE 0.32* 0.43*  CALCIUM 9.1 9.5    PT/INR:  Basename 05/22/12 1755  LABPROT 12.9  INR 0.98   ABG    Component Value Date/Time   PHART 7.316* 05/24/2012 0420   HCO3 32.0* 05/24/2012 0420   TCO2 34.0 05/24/2012 0420   O2SAT 95.5 05/24/2012 0420   CBG (last 3)  No results found for this basename: GLUCAP:3 in the last 72 hours  Assessment/Plan: S/P Procedure(s) (LRB): VIDEO BRONCHOSCOPY (N/A) VIDEO ASSISTED  THORACOSCOPY (Right) ELOESSOR PROCEDURE (Right) THORACOTOMY MAJOR (Right)  1. Chronic Lung Abscess- ID Following, on ABX Day 4 2. Pulm- extensive sub-q emphysema, seems improved since previous film, pulmonary following 3. Expected Blood Loss Anemia- Hgb 7.7 this morning, will repeat in AM if no improvement may benefit from transfusion 4. Malnutrition-appreciate dietary recommendations 5. Pain- will add Toradol for additional support 6. Dispo- OR cultures pending, repeat H/H in AM, continue current care    LOS: 3 days    BARRETT, ERIN 05/24/2012  Remains off vent, hypercarbia but tolerating Needs increased nutrition, consult requested preop anemia of chronic disease and Expected Acute  Blood - loss Anemia I have seen and examined [name] and agree with the above assessment  and plan.  Delight Ovens MD Beeper [phone] Office [phone] 05/24/2012 8:22 AM

## 2012-05-24 NOTE — Anesthesia Postprocedure Evaluation (Addendum)
  Anesthesia Post-op Note  Patient: [name] [name]  Procedure(s) Performed: Procedure(s) (LRB) with comments: VIDEO BRONCHOSCOPY (N/A) VIDEO ASSISTED THORACOSCOPY (Right) ELOESSOR PROCEDURE (Right) THORACOTOMY MAJOR (Right) - irrigation and debridment of right upper chest cavity  Patient Location:   Anesthesia Type:General  Level of Consciousness: awake, alert  and oriented  Airway and Oxygen Therapy: Patient Spontanous Breathing and Patient connected to nasal cannula oxygen  Post-op Pain: moderate  Post-op Assessment: Post-op Vital signs reviewed, Patient's Cardiovascular Status Stable, Respiratory Function Stable and Patent Airway  Post-op Vital Signs: Reviewed and stable  Complications: No apparent anesthesia complications

## 2012-05-25 ENCOUNTER — Inpatient Hospital Stay (HOSPITAL_COMMUNITY): Payer: Medicaid Other

## 2012-05-25 LAB — COMPREHENSIVE METABOLIC PANEL
AST: 10 U/L (ref 0–37)
CO2: 34 mEq/L — ABNORMAL HIGH (ref 19–32)
Calcium: 9 mg/dL (ref 8.4–10.5)
Creatinine, Ser: 0.28 mg/dL — ABNORMAL LOW (ref 0.50–1.10)
GFR calc Af Amer: >90 mL/min (ref >90)
GFR calc non Af Amer: >90 mL/min (ref >90)
Total Protein: 6.5 g/dL (ref 6.0–8.3)

## 2012-05-25 LAB — CULTURE, RESPIRATORY W GRAM STAIN

## 2012-05-25 LAB — WOUND CULTURE: Culture: NO GROWTH

## 2012-05-25 LAB — CBC
MCH: 28.8 pg (ref 26.0–34.0)
MCHC: 31.3 g/dL (ref 30.0–36.0)
MCV: 92.1 fL (ref 78.0–100.0)
Platelets: 269 10*3/uL (ref 150–400)
RBC: 2.78 MIL/uL — ABNORMAL LOW (ref 3.87–5.11)

## 2012-05-25 LAB — VANCOMYCIN, TROUGH: Vancomycin Tr: <5 ug/mL — ABNORMAL LOW (ref 10.0–20.0)

## 2012-05-25 MED ORDER — VANCOMYCIN HCL IN DEXTROSE 1-5 GM/200ML-% IV SOLN
1000.0000 mg | Freq: Two times a day (BID) | INTRAVENOUS | Status: DC
  Administered 2012-05-26 – 2012-05-27 (×3): 1000 mg via INTRAVENOUS
  Filled 2012-05-25 (×5): qty 200

## 2012-05-25 MED ORDER — VANCOMYCIN HCL IN DEXTROSE 1-5 GM/200ML-% IV SOLN
1000.0000 mg | Freq: Two times a day (BID) | INTRAVENOUS | Status: AC
  Administered 2012-05-25: 1000 mg via INTRAVENOUS

## 2012-05-25 NOTE — Progress Notes (Signed)
Patient ID: [name] [name], female   DOB: November 11, 1955, 57 y.o.   MRN: [id number] TCTS DAILY PROGRESS NOTE                   [address]            [place] [address]          [phone]      2 Days Post-Op Procedure(s) (LRB): VIDEO BRONCHOSCOPY (N/A) VIDEO ASSISTED THORACOSCOPY (Right) ELOESSOR PROCEDURE (Right) THORACOTOMY MAJOR (Right)  Total Length of Stay:  LOS: 4 days   Subjective: Up to chair feels better  Objective: Vital signs in last 24 hours: Temp:  [97.7 F (36.5 C)-98.7 F (37.1 C)] 98.4 F (36.9 C) (01/09 2306) Pulse Rate:  [66-95] 80  (01/10 0700) Cardiac Rhythm:  [-] Normal sinus rhythm (01/10 0400) Resp:  [12-25] 14  (01/10 0700) BP: (88-115)/(50-81) 89/61 mmHg (01/10 0700) SpO2:  [90 %-100 %] 99 % (01/10 0700) Arterial Line BP: (93-134)/(40-66) 134/66 mmHg (01/09 1800) Weight:  [94 lb 2.2 oz (42.7 kg)] 94 lb 2.2 oz (42.7 kg) (01/10 0500)  Filed Weights   05/21/12 1100 05/25/12 0500  Weight: 88 lb 6.5 oz (40.1 kg) 94 lb 2.2 oz (42.7 kg)    Weight change:    Hemodynamic parameters for last 24 hours:    Intake/Output from previous day: 01/09 0701 - 01/10 0700 In: 1170.5 [I.V.:668.5; IV Piggyback:502] Out: 2650 [Urine:2650]  Intake/Output this shift:    Current Meds: Scheduled Meds:   . ipratropium  0.5 mg Nebulization Q6H   And  . albuterol  2.5 mg Nebulization Q6H  . ALPRAZolam  1.5 mg Oral TID  . bisacodyl  10 mg Oral Daily  . budesonide  0.5 mg Nebulization BID  . enoxaparin  20 mg Subcutaneous Q24H  . feeding supplement  1 Container Oral TID BM  . fentaNYL   Intravenous Q4H  . guaiFENesin  600 mg Oral BID  . imipenem-cilastatin  250 mg Intravenous Q8H  . micafungin (MYCAMINE) IV  100 mg Intravenous Daily  . multivitamin with minerals  1 tablet Oral Daily  . nicotine  14 mg Transdermal Daily  . pantoprazole  40 mg Oral Q1200  . vancomycin  1,000 mg Intravenous Q24H   Continuous Infusions:   . dextrose 5 % and  0.9 % NaCl with KCl 20 mEq/L 20 mL/hr at 05/24/12 0900   PRN Meds:.albuterol, diphenhydrAMINE, diphenhydrAMINE, ketorolac, levalbuterol, naloxone, ondansetron (ZOFRAN) IV, ondansetron (ZOFRAN) IV, potassium chloride, senna-docusate, sodium chloride, traMADol  General appearance: alert and cooperative Neurologic: intact Heart: regular rate and rhythm, S1, S2 normal, no murmur, click, rub or gallop and normal apical impulse Lungs: diminished breath sounds RLL, RML and RUL Abdomen: soft, non-tender; bowel sounds normal; no masses,  no organomegaly Extremities: extremities normal, atraumatic, no cyanosis or edema and Homans sign is negative, no sign of DVT Wound: flap viable will start irrigation of space  Lab Results: CBC: Basename 05/25/12 0415 05/24/12 0412  WBC 10.9* 11.8*  HGB 8.0* 7.7*  HCT 25.6* 24.2*  PLT 269 248   BMET:  Basename 05/25/12 0415 05/24/12 0412  NA 135 132*  K 4.6 4.2  CL 97 97  CO2 34* 32  GLUCOSE 125* 128*  BUN 7 6  CREATININE 0.28* 0.32*  CALCIUM 9.0 9.1    PT/INR:  Basename 05/22/12 1755  LABPROT 12.9  INR 0.98   Radiology: Dg Chest Port 1 View  05/25/2012  *RADIOLOGY REPORT*  Clinical Data: Chronic  right lung infection.  Prior lobectomy. COPD. Recent right thoracotomy with debridement and drainage of right lung cavity and right sided open window thoracostomy on 05/23/2012.  PORTABLE CHEST - 1 VIEW  Comparison: 05/24/2012  Findings: Right IJ line tip:  Upper SVC.  Left PICC line tip: Lower SVC.  The patient is rotated to the right on today's exam, resulting in reduced diagnostic sensitivity and specificity.   Stable appearance of the right hemithoracic volume loss.  The amount of subcutaneous emphysema appears to be improving over the last 2 days.  Right rib osteotomies noted.  Emphysema is present.  There is mild peripheral triangular density in the left mid to lower lung, but otherwise the left lung remains clear.  Linear lucency along the left inferior  heart border - small amount of residual pneumothorax is not excluded.  There appears to be at least a small amount of residual aerated lung on the right.  Right upper ribs have a somewhat moth-eaten appearance, and chronic osteomyelitis is not excluded.  IMPRESSION:  1.  Slowly decreasing subcutaneous emphysema. 2.  Faint peripheral opacity in the left mid to lower lung probably represents atelectasis, less likely pneumonia. 3.  Stable appearance of volume loss in the right hemithorax, with irregular right upper ribs - chronic costal osteomyelitis is not excluded. 4.  Cannot exclude small amount of residual pneumothorax along the right heart border.   Original Report Authenticated By: Gaylyn Rong, M.D.    Dg Chest Port 1 View  05/24/2012  *RADIOLOGY REPORT*  Clinical Data: Postop VATS  PORTABLE CHEST - 1 VIEW  Comparison: Portable chest x-ray of 05/23/2012  Findings: Considerable volume loss throughout the right hemithorax is again noted with pleural and parenchymal opacity at the right lung base.   There does appear to be a right pneumothorax present. Chest wall subcutaneous air has not changed significantly.  The endotracheal tube and NG tube have been removed.  The left lung is relatively well aerated.  Left PICC line is unchanged.  IMPRESSION:  1.  No change in postoperative changes on the right with volume loss throughout the right hemithorax. 2.  Right pneumothorax. 3.  Endotracheal tube and NG tube removed.   Original Report Authenticated By: Dwyane Dee, M.D.    Dg Chest Portable 1 View  05/23/2012  *RADIOLOGY REPORT*  Clinical Data: Postoperative.  PORTABLE CHEST - 1 VIEW  Comparison: May 22, 2012.  Findings: Surgical staples are seen over the lateral right chest wall.  Extensive subcutaneous emphysema is seen bilaterally. Endotracheal tube is in grossly good position with tip approximately 2 cm above the carina.  Left subclavian catheter is unchanged in position.  There is interval placement of  right internal jugular catheter with tip in expected position of the SVC. Nasogastric tube passes through esophagus into stomach.  Left lung is clear.  Significantly decreased amount of fluid seen in right hemithorax. It is difficult to assess for pneumothorax on the right due to overlying subcutaneous emphysema.  Large amount of opacity is noted in right lower lobe consistent with atelectasis.  IMPRESSION: Postoperative changes involving right lateral chest wall. Extensive subcutaneous emphysema is seen bilaterally.  Endotracheal tube in grossly good position. Significantly decreased amount of fluid seen in right hemithorax compared to prior exam.   Original Report Authenticated By: Lupita Raider.,  M.D.      Assessment/Plan: S/P Procedure(s) (LRB): VIDEO BRONCHOSCOPY (N/A) VIDEO ASSISTED THORACOSCOPY (Right) ELOESSOR PROCEDURE (Right) THORACOTOMY MAJOR (Right) Mobilize start irrigation of eympema cavity  Joron Velis B 05/25/2012 7:57 AM

## 2012-05-25 NOTE — Progress Notes (Signed)
ANTIBIOTIC CONSULT NOTE - Follow up  Pharmacy Consult for vancomycin Indication: lung abscess  Allergies  Allergen Reactions  . Codeine Itching and Other (See Comments)    Severe anxiety  . Other Swelling and Other (See Comments)    Steroids  "feels like choking"  . Spiriva (Tiotropium Bromide Monohydrate) Other (See Comments)    Patient does not like and states it doesn't work for her    Patient Measurements: Height: 5\' 5"  (165.1 cm) Weight: 94 lb 2.2 oz (42.7 kg) IBW/kg (Calculated) : 57   Vital Signs: Temp: 97.6 F (36.4 C) (01/10 1140) Temp src: Oral (01/10 1140) BP: 110/68 mmHg (01/10 1400) Pulse Rate: 97  (01/10 1400) Intake/Output from previous day: 01/09 0701 - 01/10 0700 In: 1170.5 [I.V.:668.5; IV Piggyback:502] Out: 2650 [Urine:2650] Intake/Output from this shift: Total I/O In: 359 [I.V.:155; IV Piggyback:204] Out: 1250 [Urine:1250]  Labs:  [facility] 05/25/12 0415 05/24/12 0412 05/22/12 1755  WBC 10.9* 11.8* 11.7*  HGB 8.0* 7.7* 8.8*  PLT 269 248 331  LABCREA -- -- --  CREATININE 0.28* 0.32* 0.43*   Estimated Creatinine Clearance: 54.2 ml/min (by C-G formula based on Cr of 0.28).  Basename 05/25/12 1400  VANCOTROUGH <5.0*  VANCOPEAK --  Drue Dun --  GENTTROUGH --  GENTPEAK --  GENTRANDOM --  TOBRATROUGH --  TOBRAPEAK --  TOBRARND --  AMIKACINPEAK --  AMIKACINTROU --  AMIKACIN --     Microbiology: Recent Results (from the past 720 hour(s))  CULTURE, RESPIRATORY     Status: Normal   Collection Time   05/01/12  2:12 PM      Component Value Range Status Comment   Specimen Description BRONCHIAL WASHINGS   Final    Special Requests NONE   Final    Gram Stain     Final    Value: ABUNDANT WBC PRESENT, PREDOMINANTLY PMN     NO SQUAMOUS EPITHELIAL CELLS SEEN     NO ORGANISMS SEEN   Culture Non-Pathogenic Oropharyngeal-type Flora Isolated.   Final    Report Status 05/04/2012 FINAL   Final   FUNGUS CULTURE W SMEAR     Status: Normal  (Preliminary result)   Collection Time   05/01/12  2:12 PM      Component Value Range Status Comment   Specimen Description BRONCHIAL WASHINGS   Final    Special Requests NONE   Final    Fungal Smear NO YEAST OR FUNGAL ELEMENTS SEEN   Final    Culture CANDIDA ALBICANS   Final    Report Status PENDING   Incomplete   AFB CULTURE WITH SMEAR     Status: Normal (Preliminary result)   Collection Time   05/01/12  2:12 PM      Component Value Range Status Comment   Specimen Description BRONCHIAL WASHINGS   Final    Special Requests NONE   Final    ACID FAST SMEAR NO ACID FAST BACILLI SEEN   Final    Culture     Final    Value: CULTURE WILL BE EXAMINED FOR 6 WEEKS BEFORE ISSUING A FINAL REPORT   Report Status PENDING   Incomplete   SURGICAL PCR SCREEN     Status: Normal   Collection Time   05/22/12 11:14 PM      Component Value Range Status Comment   MRSA, PCR NEGATIVE  NEGATIVE Final    Staphylococcus aureus NEGATIVE  NEGATIVE Final   FUNGUS CULTURE W SMEAR     Status: Normal (Preliminary result)  Collection Time   05/23/12  8:58 AM      Component Value Range Status Comment   Specimen Description BRONCHIAL WASHINGS   Final    Special Requests NONE   Final    Fungal Smear NO YEAST OR FUNGAL ELEMENTS SEEN   Final    Culture CULTURE IN PROGRESS FOR FOUR WEEKS   Final    Report Status PENDING   Incomplete   AFB CULTURE WITH SMEAR     Status: Normal (Preliminary result)   Collection Time   05/23/12  8:58 AM      Component Value Range Status Comment   Specimen Description BRONCHIAL WASHINGS   Final    Special Requests NONE   Final    ACID FAST SMEAR NO ACID FAST BACILLI SEEN   Final    Culture     Final    Value: CULTURE WILL BE EXAMINED FOR 6 WEEKS BEFORE ISSUING A FINAL REPORT   Report Status PENDING   Incomplete   GRAM STAIN     Status: Normal   Collection Time   05/23/12  8:58 AM      Component Value Range Status Comment   Specimen Description BRONCHIAL WASHINGS   Final    Special  Requests NONE   Final    Gram Stain     Final    Value: ABUNDANT WBC PRESENT, PREDOMINANTLY PMN     FEW GRAM POSITIVE COCCI IN PAIRS     CALLED TO Andrey Campanile D RN 05/23/12 0935 COSTELLO B   Report Status 05/23/2012 FINAL   Final   CULTURE, RESPIRATORY     Status: Normal   Collection Time   05/23/12  8:58 AM      Component Value Range Status Comment   Specimen Description BRONCHIAL WASHINGS   Final    Special Requests NONE RIGHT LUNG   Final    Gram Stain     Final    Value: ABUNDANT WBC PRESENT, PREDOMINANTLY PMN     FEW GRAM POSITIVE COCCI IN PAIRS     Gram Stain Report Called to,Read Back By and Verified With: Gram Stain Report Called to,Read Back By and Verified With:  WILSON D RN @09 :36 ON 05/23/12 BY COSTELLO B   Culture Non-Pathogenic Oropharyngeal-type Flora Isolated.   Final    Report Status 05/25/2012 FINAL   Final   WOUND CULTURE     Status: Normal   Collection Time   05/23/12 10:39 AM      Component Value Range Status Comment   Specimen Description WOUND RIGHT CHEST   Final    Special Requests SWAB OF THE UPPER CHEST CAVITY    Final    Gram Stain     Final    Value: ABUNDANT WBC PRESENT,BOTH PMN AND MONONUCLEAR     RARE SQUAMOUS EPITHELIAL CELLS PRESENT     RARE GRAM POSITIVE COCCI IN PAIRS   Culture NO GROWTH 2 DAYS   Final    Report Status 05/25/2012 FINAL   Final   ANAEROBIC CULTURE     Status: Normal (Preliminary result)   Collection Time   05/23/12 10:39 AM      Component Value Range Status Comment   Specimen Description WOUND RIGHT CHEST   Final    Special Requests SWAB OF THE UPPER CHEST CAVITY   Final    Gram Stain     Final    Value: ABUNDANT WBC PRESENT,BOTH PMN AND MONONUCLEAR     RARE SQUAMOUS EPITHELIAL CELLS  PRESENT     RARE GRAM POSITIVE COCCI IN PAIRS   Culture     Final    Value: NO ANAEROBES ISOLATED; CULTURE IN PROGRESS FOR 5 DAYS   Report Status PENDING   Incomplete     Medical History: Past Medical History  Diagnosis Date  . Depression with anxiety     . COPD (chronic obstructive pulmonary disease)   . Empyema   . GERD (gastroesophageal reflux disease)   . Lung cancer     RULobectomy 2001, stage I adenocarcinoma T2, N0, Mx    . H/O pulmonary fibrosis   . Tobacco use disorder   . Vocal cord paralysis   . Abnormal weight loss   . Pneumonia   . Pleural effusion   . Thyroid nodule     tiny on right   . Hydropneumothorax   . Normocytic anemia   . Hemoptysis   . Shortness of breath     with exertion    Medications:  Prescriptions prior to admission  Medication Sig Dispense Refill  . albuterol (PROVENTIL HFA;VENTOLIN HFA) 108 (90 BASE) MCG/ACT inhaler Inhale 2 puffs into the lungs every 4 (four) hours as needed. For shortness of breath      . ALPRAZolam (XANAX) 1 MG tablet Take 1.5 mg by mouth 3 (three) times daily.       Marland Kitchen omeprazole (PRILOSEC) 40 MG capsule Take 40 mg by mouth 2 (two) times daily.        Assessment: 57 y/o female receiving day#3 vancomycin empirically for a lung abscess s/p VATS. Renal function is normal at baseline, WBC are slightly elevated but trending down, and patient is afebrile. cx pending. Obtained trough today, surprisingly undetectable level. Will adjust abx.Crcl is 54 but actually has very high output >2.6L yesterday.  Goal of Therapy:  Vancomycin trough level 15-20 mcg/ml  Plan:  Change vancomycin 1g IV q12h and continue to follow.  Verlene Mayer, PharmD, BCPS Pager [phone] 05/25/2012 3:06 PM

## 2012-05-25 NOTE — Progress Notes (Signed)
INFECTIOUS DISEASE PROGRESS NOTE  ID: Keenan Dimitrov is a 57 y.o. female with  Active Problems:  COPD (chronic obstructive pulmonary disease)  Subjective: Without complaints.   Abtx:  Anti-infectives     Start     Dose/Rate Route Frequency Ordered Stop   05/22/12 1500   vancomycin (VANCOCIN) IVPB 1000 mg/200 mL premix        1,000 mg 200 mL/hr over 60 Minutes Intravenous Every 24 hours 05/22/12 1413     05/21/12 1400   imipenem-cilastatin (PRIMAXIN) 500 mg in sodium chloride 0.9 % 100 mL IVPB  Status:  Discontinued        500 mg 200 mL/hr over 30 Minutes Intravenous 3 times per day 05/21/12 1136 05/21/12 1141   05/21/12 1400   imipenem-cilastatin (PRIMAXIN) 250 mg in sodium chloride 0.9 % 100 mL IVPB        250 mg 200 mL/hr over 30 Minutes Intravenous 3 times per day 05/21/12 1141     05/21/12 1300   micafungin (MYCAMINE) 100 mg in sodium chloride 0.9 % 100 mL IVPB        100 mg 100 mL/hr over 1 Hours Intravenous Daily 05/21/12 1136            Medications:  Scheduled:   . ipratropium  0.5 mg Nebulization Q6H   And  . albuterol  2.5 mg Nebulization Q6H  . ALPRAZolam  1.5 mg Oral TID  . bisacodyl  10 mg Oral Daily  . budesonide  0.5 mg Nebulization BID  . enoxaparin  20 mg Subcutaneous Q24H  . feeding supplement  1 Container Oral TID BM  . fentaNYL   Intravenous Q4H  . guaiFENesin  600 mg Oral BID  . imipenem-cilastatin  250 mg Intravenous Q8H  . multivitamin with minerals  1 tablet Oral Daily  . nicotine  14 mg Transdermal Daily  . pantoprazole  40 mg Oral Q1200  . vancomycin  1,000 mg Intravenous Q24H    Objective: Vital signs in last 24 hours: Temp:  [97.7 F (36.5 C)-98.7 F (37.1 C)] 98.2 F (36.8 C) (01/10 0812) Pulse Rate:  [77-95] 93  (01/10 1000) Resp:  [12-25] 18  (01/10 1000) BP: (88-118)/(50-81) 118/63 mmHg (01/10 1000) SpO2:  [90 %-100 %] 98 % (01/10 1000) Arterial Line BP: (93-134)/(40-66) 134/66 mmHg (01/09 1800) FiO2 (%):  [0 %] 0 %  (01/10 0800) Weight:  [42.7 kg (94 lb 2.2 oz)] 42.7 kg (94 lb 2.2 oz) (01/10 0500)   General appearance: alert, cooperative and no distress Resp: rhonchi bilaterally Cardio: regular rate and rhythm GI: normal findings: bowel sounds normal and soft, non-tender  Lab Results  Basename 05/25/12 0415 05/24/12 0412  WBC 10.9* 11.8*  HGB 8.0* 7.7*  HCT 25.6* 24.2*  NA 135 132*  K 4.6 4.2  CL 97 97  CO2 34* 32  BUN 7 6  CREATININE 0.28* 0.32*  GLU -- --   Liver Panel  Basename 05/25/12 0415 05/22/12 1755  PROT 6.5 7.3  ALBUMIN 2.0* 2.2*  AST 10 8  ALT 5 <5  ALKPHOS 68 65  BILITOT 0.1* 0.1*  BILIDIR -- --  IBILI -- --   Sedimentation Rate No results found for this basename: ESRSEDRATE in the last 72 hours C-Reactive Protein No results found for this basename: CRP:2 in the last 72 hours  Microbiology: Recent Results (from the past 240 hour(s))  SURGICAL PCR SCREEN     Status: Normal   Collection Time   05/22/12 11:14  PM      Component Value Range Status Comment   MRSA, PCR NEGATIVE  NEGATIVE Final    Staphylococcus aureus NEGATIVE  NEGATIVE Final   FUNGUS CULTURE W SMEAR     Status: Normal (Preliminary result)   Collection Time   05/23/12  8:58 AM      Component Value Range Status Comment   Specimen Description BRONCHIAL WASHINGS   Final    Special Requests NONE   Final    Fungal Smear NO YEAST OR FUNGAL ELEMENTS SEEN   Final    Culture CULTURE IN PROGRESS FOR FOUR WEEKS   Final    Report Status PENDING   Incomplete   AFB CULTURE WITH SMEAR     Status: Normal (Preliminary result)   Collection Time   05/23/12  8:58 AM      Component Value Range Status Comment   Specimen Description BRONCHIAL WASHINGS   Final    Special Requests NONE   Final    ACID FAST SMEAR NO ACID FAST BACILLI SEEN   Final    Culture     Final    Value: CULTURE WILL BE EXAMINED FOR 6 WEEKS BEFORE ISSUING A FINAL REPORT   Report Status PENDING   Incomplete   GRAM STAIN     Status: Normal    Collection Time   05/23/12  8:58 AM      Component Value Range Status Comment   Specimen Description BRONCHIAL WASHINGS   Final    Special Requests NONE   Final    Gram Stain     Final    Value: ABUNDANT WBC PRESENT, PREDOMINANTLY PMN     FEW GRAM POSITIVE COCCI IN PAIRS     CALLED TO Andrey Campanile D RN 05/23/12 0935 COSTELLO B   Report Status 05/23/2012 FINAL   Final   CULTURE, RESPIRATORY     Status: Normal   Collection Time   05/23/12  8:58 AM      Component Value Range Status Comment   Specimen Description BRONCHIAL WASHINGS   Final    Special Requests NONE RIGHT LUNG   Final    Gram Stain     Final    Value: ABUNDANT WBC PRESENT, PREDOMINANTLY PMN     FEW GRAM POSITIVE COCCI IN PAIRS     Gram Stain Report Called to,Read Back By and Verified With: Gram Stain Report Called to,Read Back By and Verified With:  Elby Showers RN @09 :36 ON 05/23/12 BY COSTELLO B   Culture Non-Pathogenic Oropharyngeal-type Flora Isolated.   Final    Report Status 05/25/2012 FINAL   Final   WOUND CULTURE     Status: Normal   Collection Time   05/23/12 10:39 AM      Component Value Range Status Comment   Specimen Description WOUND RIGHT CHEST   Final    Special Requests SWAB OF THE UPPER CHEST CAVITY    Final    Gram Stain     Final    Value: ABUNDANT WBC PRESENT,BOTH PMN AND MONONUCLEAR     RARE SQUAMOUS EPITHELIAL CELLS PRESENT     RARE GRAM POSITIVE COCCI IN PAIRS   Culture NO GROWTH 2 DAYS   Final    Report Status 05/25/2012 FINAL   Final   ANAEROBIC CULTURE     Status: Normal (Preliminary result)   Collection Time   05/23/12 10:39 AM      Component Value Range Status Comment   Specimen Description WOUND RIGHT CHEST  Final    Special Requests SWAB OF THE UPPER CHEST CAVITY   Final    Gram Stain     Final    Value: ABUNDANT WBC PRESENT,BOTH PMN AND MONONUCLEAR     RARE SQUAMOUS EPITHELIAL CELLS PRESENT     RARE GRAM POSITIVE COCCI IN PAIRS   Culture     Final    Value: NO ANAEROBES ISOLATED; CULTURE IN PROGRESS  FOR 5 DAYS   Report Status PENDING   Incomplete     Studies/Results: Dg Chest Port 1 View  05/25/2012  *RADIOLOGY REPORT*  Clinical Data: Chronic right lung infection.  Prior lobectomy. COPD. Recent right thoracotomy with debridement and drainage of right lung cavity and right sided open window thoracostomy on 05/23/2012.  PORTABLE CHEST - 1 VIEW  Comparison: 05/24/2012  Findings: Right IJ line tip:  Upper SVC.  Left PICC line tip: Lower SVC.  The patient is rotated to the right on today's exam, resulting in reduced diagnostic sensitivity and specificity.   Stable appearance of the right hemithoracic volume loss.  The amount of subcutaneous emphysema appears to be improving over the last 2 days.  Right rib osteotomies noted.  Emphysema is present.  There is mild peripheral triangular density in the left mid to lower lung, but otherwise the left lung remains clear.  Linear lucency along the left inferior heart border - small amount of residual pneumothorax is not excluded.  There appears to be at least a small amount of residual aerated lung on the right.  Right upper ribs have a somewhat moth-eaten appearance, and chronic osteomyelitis is not excluded.  IMPRESSION:  1.  Slowly decreasing subcutaneous emphysema. 2.  Faint peripheral opacity in the left mid to lower lung probably represents atelectasis, less likely pneumonia. 3.  Stable appearance of volume loss in the right hemithorax, with irregular right upper ribs - chronic costal osteomyelitis is not excluded. 4.  Cannot exclude small amount of residual pneumothorax along the right heart border.   Original Report Authenticated By: Gaylyn Rong, M.D.    Dg Chest Port 1 View  05/24/2012  *RADIOLOGY REPORT*  Clinical Data: Postop VATS  PORTABLE CHEST - 1 VIEW  Comparison: Portable chest x-ray of 05/23/2012  Findings: Considerable volume loss throughout the right hemithorax is again noted with pleural and parenchymal opacity at the right lung base.    There does appear to be a right pneumothorax present. Chest wall subcutaneous air has not changed significantly.  The endotracheal tube and NG tube have been removed.  The left lung is relatively well aerated.  Left PICC line is unchanged.  IMPRESSION:  1.  No change in postoperative changes on the right with volume loss throughout the right hemithorax. 2.  Right pneumothorax. 3.  Endotracheal tube and NG tube removed.   Original Report Authenticated By: Dwyane Dee, M.D.    Dg Chest Portable 1 View  05/23/2012  *RADIOLOGY REPORT*  Clinical Data: Postoperative.  PORTABLE CHEST - 1 VIEW  Comparison: May 22, 2012.  Findings: Surgical staples are seen over the lateral right chest wall.  Extensive subcutaneous emphysema is seen bilaterally. Endotracheal tube is in grossly good position with tip approximately 2 cm above the carina.  Left subclavian catheter is unchanged in position.  There is interval placement of right internal jugular catheter with tip in expected position of the SVC. Nasogastric tube passes through esophagus into stomach.  Left lung is clear.  Significantly decreased amount of fluid seen in right hemithorax. It is  difficult to assess for pneumothorax on the right due to overlying subcutaneous emphysema.  Large amount of opacity is noted in right lower lobe consistent with atelectasis.  IMPRESSION: Postoperative changes involving right lateral chest wall. Extensive subcutaneous emphysema is seen bilaterally.  Endotracheal tube in grossly good position. Significantly decreased amount of fluid seen in right hemithorax compared to prior exam.   Original Report Authenticated By: Lupita Raider.,  M.D.      Assessment/Plan: Lung Abscess  Bipolar d/o  COPD  Total days of antibiotics 5 (mycfungin/vanco/imipenem)  Her Cx are (-), unrevealing.  Will stop her mycfungin. Available if questions over the weekend.      [name] Infectious Diseases [phone] 05/25/2012, 10:39 AM   LOS: 4  days

## 2012-05-25 NOTE — Progress Notes (Signed)
TCTS BRIEF SICU PROGRESS NOTE  2 Days Post-Op  S/P Procedure(s) (LRB): VIDEO BRONCHOSCOPY (N/A) VIDEO ASSISTED THORACOSCOPY (Right) ELOESSOR PROCEDURE (Right) THORACOTOMY MAJOR (Right)   Stable day  Plan: Continue current plan  Curtisha Bendix H 05/25/2012 7:36 PM

## 2012-05-25 NOTE — Progress Notes (Signed)
Nursing note:   Patient stated she was having urinary discomfort. Day shift RN reported decrease in urine output through foley, foley was flushed with no improvement. Foley was check for positional blockage of flow with no change in output. Bladder scan was done showing no volume in the bladder but patient still states her bladder felt extremely full. Patient foley removed appearing to be clotted, patient given time to urinate on her own with out any result foley reinserted and output of 400. Will continue to monitor.

## 2012-05-25 NOTE — Progress Notes (Signed)
Pt refuses to lay in the bed, wants to stay in the recliner at all times, pt rotates herself around in the chair from hip to hip to stay off her bottom, MD aware of pt refusing to lay down

## 2012-05-26 ENCOUNTER — Inpatient Hospital Stay (HOSPITAL_COMMUNITY): Payer: Medicaid Other

## 2012-05-26 LAB — TYPE AND SCREEN
ABO/RH(D): O POS
Antibody Screen: NEGATIVE
Unit division: 0
Unit division: 0

## 2012-05-26 LAB — BASIC METABOLIC PANEL
BUN: 4 mg/dL — ABNORMAL LOW (ref 6–23)
CO2: 37 mEq/L — ABNORMAL HIGH (ref 19–32)
Calcium: 9.6 mg/dL (ref 8.4–10.5)
Chloride: 95 mEq/L — ABNORMAL LOW (ref 96–112)
Creatinine, Ser: 0.32 mg/dL — ABNORMAL LOW (ref 0.50–1.10)
GFR calc Af Amer: >90 mL/min (ref >90)
GFR calc non Af Amer: >90 mL/min (ref >90)
Glucose, Bld: 114 mg/dL — ABNORMAL HIGH (ref 70–99)
Potassium: 4.3 mEq/L (ref 3.5–5.1)
Sodium: 136 mEq/L (ref 135–145)

## 2012-05-26 LAB — CBC
HCT: 25.1 % — ABNORMAL LOW (ref 36.0–46.0)
Hemoglobin: 7.8 g/dL — ABNORMAL LOW (ref 12.0–15.0)
MCH: 28.9 pg (ref 26.0–34.0)
MCHC: 31.1 g/dL (ref 30.0–36.0)
MCV: 93 fL (ref 78.0–100.0)
Platelets: 282 10*3/uL (ref 150–400)
RBC: 2.7 MIL/uL — ABNORMAL LOW (ref 3.87–5.11)
RDW: 15.4 % (ref 11.5–15.5)
WBC: 12.5 10*3/uL — ABNORMAL HIGH (ref 4.0–10.5)

## 2012-05-26 NOTE — Evaluation (Signed)
Physical Therapy Evaluation Patient Details Name: [name] [name] MRN: [id number] DOB: 07/14/1955 Today's Date: 05/26/2012 Time: 4132-4401 PT Time Calculation (min): 19 min  PT Assessment / Plan / Recommendation Clinical Impression  Pt adm with chronic rt lung infection and underwent VATS, thoracotomy and Eloessor procedure.  Pt lives alone. Recommend ST-SNF prior to returning home.    PT Assessment  Patient needs continued PT services    Follow Up Recommendations  SNF    Does the patient have the potential to tolerate intense rehabilitation      Barriers to Discharge Decreased caregiver support      Equipment Recommendations  Rolling walker with 5" wheels    Recommendations for Other Services     Frequency Min 3X/week    Precautions / Restrictions Precautions Precautions: Fall   Pertinent Vitals/Pain Surgical pain.  Using PCA.      Mobility  Transfers Transfers: Sit to Stand;Stand to Sit Sit to Stand: 4: Min assist;With upper extremity assist;From chair/3-in-1 Stand to Sit: 4: Min assist;With upper extremity assist;To chair/3-in-1 Details for Transfer Assistance: Pt using recliner as bed by propping legrest of recliner up on another chair and having a layer of pillow on top of it.  Pt gets up by leaving legrest propped up and bringing legs off the side of the legrest and then coming to stand. Ambulation/Gait Ambulation/Gait Assistance: 4: Min assist Ambulation Distance (Feet): 150 Feet Assistive device: Other (Comment) (pushing w/c) Ambulation/Gait Assistance Details: verbal cues to stand more erect and look up. Gait Pattern: Step-through pattern;Decreased stride length;Trunk flexed;Narrow base of support    Shoulder Instructions     Exercises     PT Diagnosis: Difficulty walking;Generalized weakness;Acute pain  PT Problem List: Decreased strength;Decreased activity tolerance;Decreased balance;Decreased mobility;Decreased knowledge of use of DME PT Treatment  Interventions: DME instruction;Gait training;Functional mobility training;Patient/family education;Therapeutic activities;Therapeutic exercise;Balance training   PT Goals Acute Rehab PT Goals PT Goal Formulation: With patient Time For Goal Achievement: 06/09/12 Potential to Achieve Goals: Good Pt will go Sit to Stand: with modified independence PT Goal: Sit to Stand - Progress: Goal set today Pt will go Stand to Sit: with modified independence PT Goal: Stand to Sit - Progress: Goal set today Pt will Ambulate: >150 feet;with modified independence PT Goal: Ambulate - Progress: Goal set today  Visit Information  Last PT Received On: 05/26/12 Assistance Needed: +2 (for lines)    Subjective Data  Subjective: Pt states she lives alone. Patient Stated Goal: Go to SNF prior to return home.   Prior Functioning  Home Living Lives With: Alone Type of Home: Skilled Nursing Facility Prior Function Level of Independence: Independent Vocation: On disability Communication Communication: No difficulties    Cognition  Overall Cognitive Status: Appears within functional limits for tasks assessed/performed Arousal/Alertness: Awake/alert Orientation Level: Appears intact for tasks assessed Behavior During Session: [facility] for tasks performed    Extremity/Trunk Assessment Right Lower Extremity Assessment RLE ROM/Strength/Tone: Deficits RLE ROM/Strength/Tone Deficits: grossly 4/5 Left Lower Extremity Assessment LLE ROM/Strength/Tone: Deficits LLE ROM/Strength/Tone Deficits: grossly 4/5   Balance Static Standing Balance Static Standing - Balance Support: Left upper extremity supported Static Standing - Level of Assistance: 4: Min assist  End of Session PT - End of Session Activity Tolerance: Patient limited by fatigue;Patient limited by pain Patient left: in chair;with call bell/phone within reach;with family/visitor present Nurse Communication: Mobility status  GP      [name] [name] 05/26/2012, 1:34 PM  [employer] PT [phone]

## 2012-05-26 NOTE — Progress Notes (Signed)
[facility] BRIEF SICU PROGRESS NOTE  3 Days Post-Op  S/P Procedure(s) (LRB): VIDEO BRONCHOSCOPY (N/A) VIDEO ASSISTED THORACOSCOPY (Right) ELOESSOR PROCEDURE (Right) THORACOTOMY MAJOR (Right)   Stable day  Plan: Continue current plan  OWEN,CLARENCE H 05/26/2012 3:54 PM

## 2012-05-26 NOTE — Progress Notes (Signed)
   CARDIOTHORACIC SURGERY PROGRESS NOTE  3 Days Post-Op  S/P Procedure(s) (LRB): VIDEO BRONCHOSCOPY (N/A) VIDEO ASSISTED THORACOSCOPY (Right) ELOESSOR PROCEDURE (Right) THORACOTOMY MAJOR (Right)  Subjective: Feels sore in chest.  Reports breathing okay w/ O2 sats 88-90%  Objective: Vital signs in last 24 hours: Temp:  [97.3 F (36.3 C)-99 F (37.2 C)] 98.3 F (36.8 C) (01/11 1134) Pulse Rate:  [79-130] 99  (01/11 1100) Cardiac Rhythm:  [-] Normal sinus rhythm (01/11 0800) Resp:  [13-24] 20  (01/11 1100) BP: (81-110)/(46-72) 103/60 mmHg (01/11 1100) SpO2:  [89 %-100 %] 89 % (01/11 1100) FiO2 (%):  [0 %] 0 % (01/10 1600) Weight:  [41.459 kg (91 lb 6.4 oz)] 41.459 kg (91 lb 6.4 oz) (01/11 0500)  Physical Exam:  Rhythm:   sinus  Breath sounds: Markedly diminished on right, few rhonchi on left  Heart sounds:  RRR  Incisions:  Dressing intact  Abdomen:  soft  Extremities:  warm    Intake/Output from previous day: 01/10 0701 - 01/11 0700 In: 3930 [P.O.:2600; I.V.:526; IV Piggyback:804] Out: 3600 [Urine:3600] Intake/Output this shift: Total I/O In: 576.5 [P.O.:480; I.V.:96.5] Out: 550 [Urine:550]  Lab Results:  [facility] 05/26/12 0417 05/25/12 0415  WBC 12.5* 10.9*  HGB 7.8* 8.0*  HCT 25.1* 25.6*  PLT 282 269   BMET:  Basename 05/26/12 0417 05/25/12 0415  NA 136 135  K 4.3 4.6  CL 95* 97  CO2 37* 34*  GLUCOSE 114* 125*  BUN 4* 7  CREATININE 0.32* 0.28*  CALCIUM 9.6 9.0    CBG (last 3)  No results found for this basename: GLUCAP:3 in the last 72 hours  CXR:  *RADIOLOGY REPORT*  Clinical Data: Postop. Chest tube.  PORTABLE CHEST - 1 VIEW  Comparison: 05/25/2012  Findings: Postoperative changes on the right. Extensive  subcutaneous air. Lucency along the right heart border likely  reflects a small pneumothorax, stable. Diffuse right lung airspace  disease.  COPD changes present. Faint opacity peripherally in the left  midlung is stable.  IMPRESSION:    No significant change.  Original Report Authenticated By: Charlett Nose, M.D.   Assessment/Plan: S/P Procedure(s) (LRB): VIDEO BRONCHOSCOPY (N/A) VIDEO ASSISTED THORACOSCOPY (Right) ELOESSOR PROCEDURE (Right) THORACOTOMY MAJOR (Right)  Overall stable Expected post op acute blood loss anemia, stable Chronic hypoxemia, respiratory insufficiency Malnutrition   Continue wound care  Watch Hgb  Continue nutritional supplements  Continue Abx per ID team  Copeland Lapier H 05/26/2012 11:42 AM

## 2012-05-27 LAB — COMPREHENSIVE METABOLIC PANEL
ALT: 6 U/L (ref 0–35)
Alkaline Phosphatase: 66 U/L (ref 39–117)
BUN: 6 mg/dL (ref 6–23)
Chloride: 92 mEq/L — ABNORMAL LOW (ref 96–112)
GFR calc Af Amer: >90 mL/min (ref >90)
Glucose, Bld: 103 mg/dL — ABNORMAL HIGH (ref 70–99)
Potassium: 3.8 mEq/L (ref 3.5–5.1)
Sodium: 134 mEq/L — ABNORMAL LOW (ref 135–145)
Total Bilirubin: 0.1 mg/dL — ABNORMAL LOW (ref 0.3–1.2)
Total Protein: 6.9 g/dL (ref 6.0–8.3)

## 2012-05-27 LAB — CBC
HCT: 23.6 % — ABNORMAL LOW (ref 36.0–46.0)
MCHC: 31.8 g/dL (ref 30.0–36.0)
MCV: 91.1 fL (ref 78.0–100.0)
Platelets: 271 10*3/uL (ref 150–400)
RDW: 15.6 % — ABNORMAL HIGH (ref 11.5–15.5)
WBC: 11.5 10*3/uL — ABNORMAL HIGH (ref 4.0–10.5)

## 2012-05-27 LAB — VANCOMYCIN, TROUGH: Vancomycin Tr: 13.4 ug/mL (ref 10.0–20.0)

## 2012-05-27 MED ORDER — FUROSEMIDE 10 MG/ML IJ SOLN
20.0000 mg | Freq: Once | INTRAMUSCULAR | Status: AC
  Administered 2012-05-27: 20 mg via INTRAVENOUS
  Filled 2012-05-27: qty 2

## 2012-05-27 MED ORDER — VANCOMYCIN HCL 10 G IV SOLR
1250.0000 mg | Freq: Two times a day (BID) | INTRAVENOUS | Status: DC
  Administered 2012-05-27 – 2012-05-31 (×7): 1250 mg via INTRAVENOUS
  Filled 2012-05-27 (×9): qty 1250

## 2012-05-27 NOTE — Progress Notes (Signed)
   CARDIOTHORACIC SURGERY PROGRESS NOTE   R4 Days Post-Op Procedure(s) (LRB): VIDEO BRONCHOSCOPY (N/A) VIDEO ASSISTED THORACOSCOPY (Right) ELOESSOR PROCEDURE (Right) THORACOTOMY MAJOR (Right)  Subjective: Reports no change in pain right chest.  Denies SOB  Objective: Vital signs: BP Readings from Last 1 Encounters:  05/27/12 102/69   Pulse Readings from Last 1 Encounters:  05/27/12 67   Resp Readings from Last 1 Encounters:  05/27/12 17   Temp Readings from Last 1 Encounters:  05/27/12 97.5 F (36.4 C) Oral    Hemodynamics:    Physical Exam:  Rhythm:   sinus  Breath sounds: Clear on left  Heart sounds:  RRR  Incisions:  Dressing intact  Abdomen:  soft  Extremities:  warm   Intake/Output from previous day: 01/11 0701 - 01/12 0700 In: 2113.5 [P.O.:960; I.V.:553.5; IV Piggyback:600] Out: 2925 [Urine:2925] Intake/Output this shift: Total I/O In: 196.5 [P.O.:120; I.V.:76.5] Out: 100 [Urine:100]  Lab Results:  Basename 05/27/12 0400 05/26/12 0417  WBC 11.5* 12.5*  HGB 7.5* 7.8*  HCT 23.6* 25.1*  PLT 271 282   BMET:  Basename 05/27/12 0400 05/26/12 0417  NA 134* 136  K 3.8 4.3  CL 92* 95*  CO2 36* 37*  GLUCOSE 103* 114*  BUN 6 4*  CREATININE 0.35* 0.32*  CALCIUM 9.3 9.6    CBG (last 3)  No results found for this basename: GLUCAP:3 in the last 72 hours ABG    Component Value Date/Time   PHART 7.316* 05/24/2012 0420   HCO3 32.0* 05/24/2012 0420   TCO2 34.0 05/24/2012 0420   O2SAT 95.5 05/24/2012 0420   CXR: *RADIOLOGY REPORT*  Clinical Data: Postop. Chest tube.  PORTABLE CHEST - 1 VIEW  Comparison: 05/25/2012  Findings: Postoperative changes on the right. Extensive  subcutaneous air. Lucency along the right heart border likely  reflects a small pneumothorax, stable. Diffuse right lung airspace  disease.  COPD changes present. Faint opacity peripherally in the left  midlung is stable.  IMPRESSION:  No significant change.  Original Report  Authenticated By: Charlett Nose, M.D.   Assessment/Plan: S/P Procedure(s) (LRB): VIDEO BRONCHOSCOPY (N/A) VIDEO ASSISTED THORACOSCOPY (Right) ELOESSOR PROCEDURE (Right) THORACOTOMY MAJOR (Right)  Overall stable Chronic respiratory failure w/ chronic hypoxemia Still w/ high O2 requirement but stable, likely increased shunt fraction w/ poorly aerated RLL Expected post op acute blood loss anemia, worse Chronic pain w/ narcotic dependence Malnutrition   Transfuse 1 unit PRBCs today  Continue wound care and antibiotics  Mobilize as much as possible    Latifah Padin H 05/27/2012 11:34 AM

## 2012-05-27 NOTE — Plan of Care (Signed)
Problem: Phase II Progression Outcomes Goal: Progressing with IS, TCDB Outcome: Not Met (add Reason) Patient refusing to perform IS and Deep breathing exercises

## 2012-05-27 NOTE — Progress Notes (Signed)
PULMONARY  / CRITICAL CARE MEDICINE  Name: [name] [name] MRN: [id number] DOB: 24-Jul-1955    LOS: 6  REFERRING PROVIDER:   Tyrone Sage   CHIEF COMPLAINT:   Lung Abscess  BRIEF PATIENT DESCRIPTION:  57 year old active smoker w/ remote h/o lung CA. She is s/p RULobectomy~12 y ago. Since this time she has been treated at [facility] and [facility] for what was felt to be chronic pleural space infection. She presented to [facility] on 12/17 for elective bronchoscopy in setting of progressive wasting / cachexia which demonstrated no airway obstruction but significant pulmonary secretions.  She discharged AMA prior eval being completed.  Followed up in office with Dr. Tyrone Sage 1/2 and was re-admitted 1/6 for planned completion pneumonectomy / drainage of infected R pleural space.   12/13 FEV1 1.10 (39%) & DLCO 20%   LINES / TUBES: LUE PICC 1/6 >> oett 1/8>>>1/8  CULTURES: AFB 12/17>>>no AFB>>> Fungus 12/17>>>neg>>> resp culture 12/17>>>abundant wbc's, non-pathogenic flora Wound gm stain 1/8: gpc pairs>>> Pleural space culture 1/8>>>  ANTIBIOTICS: mycafungin 1/6>>> vanc 1/6>>> Imipenem 1/6>>>  SIGNIFICANT EVENTS:  12/17 - Admit for bronch in setting of progressive wasting, neg for tumor but significant secretions 12/31 - LEFT AMA 1/6 -     Re-admit from [facility] office for further work up - pneumonectomy / drainage of pleural space infection S/P VATS w/ debridement and drainage of right lung cavity and Right Eloessor procedure 1/8 1/8 extubated  INTERVAL HISTORY:   Resting without complaints.     VITAL SIGNS: Temp:  [97.5 F (36.4 C)-98.6 F (37 C)] 97.5 F (36.4 C) (01/12 0752) Pulse Rate:  [69-130] 72  (01/12 0700) Resp:  [15-26] 21  (01/12 0700) BP: (87-120)/(53-101) 97/66 mmHg (01/12 0600) SpO2:  [87 %-100 %] 89 % (01/12 0700) Weight:  [90 lb 6.2 oz (41 kg)] 90 lb 6.2 oz (41 kg) (01/12 0600)  PHYSICAL EXAMINATION: General:  Frail, cachectic female, Sitting in chair. Psych: looks  anxious Neuro:  Awake and oriented  HEENT: mm pink/moist Cardiovascular:  rrr Lungs:  No wheeze, decreased on right Abdomen:  Soft, non-tender  Musculoskeletal:  Intact Skin:  Intact    Lab 05/27/12 0400 05/26/12 0417 05/25/12 0415  NA 134* 136 135  K 3.8 4.3 4.6  CL 92* 95* 97  CO2 36* 37* 34*  BUN 6 4* 7  CREATININE 0.35* 0.32* 0.28*  GLUCOSE 103* 114* 125*    Lab 05/27/12 0400 05/26/12 0417 05/25/12 0415  HGB 7.5* 7.8* 8.0*  HCT 23.6* 25.1* 25.6*  WBC 11.5* 12.5* 10.9*  PLT 271 282 269   Dg Chest Port 1 View  05/26/2012  *RADIOLOGY REPORT*  Clinical Data: Postop.  Chest tube.  PORTABLE CHEST - 1 VIEW  Comparison: 05/25/2012  Findings: Postoperative changes on the right.  Extensive subcutaneous air.  Lucency along the right heart border likely reflects a small pneumothorax, stable.  Diffuse right lung airspace disease.  COPD changes present.  Faint opacity peripherally in the left midlung is stable.  IMPRESSION: No significant change.   Original Report Authenticated By: Charlett Nose, M.D.     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  2) S/P VATS w/ debridement and drainage of right lung cavity and Right Eloessor procedure 1/8 3) COPD (chronic obstructive pulmonary disease): very advanced w/ poor functional status, 4) Tobacco Abuse - continues to smoke 10 cigs per day 5) post-op  Respiratory failure/ ventilatory dependence: currently awake, can f/c. Her right lung was likely useless pre-op so don't think post-op changes will affect her respiratory ability at this point.   Plan: xanax per tid home dose Post-op care per thoracic surg Cont abx per ID Cont BDs PT/OT   Depression with anxiety  Plan: Symptom support; change xanax to scheduled, caution for over sedation  GERD (gastroesophageal reflux disease) Abnormal weight loss: due to  #1  Plan: -nutrition consult -ensure -protonix  Canary Brim, NP-C [facility] Pgr: [phone] or [phone]    05/27/2012 8:15 AM

## 2012-05-27 NOTE — Progress Notes (Signed)
ANTIBIOTIC CONSULT NOTE - FOLLOW UP  Pharmacy Consult for vancomycin Indication: Lung abscess  Allergies  Allergen Reactions  . Codeine Itching and Other (See Comments)    Severe anxiety  . Other Swelling and Other (See Comments)    Steroids  "feels like choking"  . Spiriva (Tiotropium Bromide Monohydrate) Other (See Comments)    Patient does not like and states it doesn't work for her    Patient Measurements: Height: 5\' 5"  (165.1 cm) Weight: 90 lb 6.2 oz (41 kg) IBW/kg (Calculated) : 57  Adjusted Body Weight:   Vital Signs: Temp: 97.5 F (36.4 C) (01/12 0752) Temp src: Oral (01/12 0752) BP: 99/59 mmHg (01/12 1300) Pulse Rate: 68  (01/12 1300) Intake/Output from previous day: 01/11 0701 - 01/12 0700 In: 2113.5 [P.O.:960; I.V.:553.5; IV Piggyback:600] Out: 2925 [Urine:2925] Intake/Output from this shift: Total I/O In: 244 [P.O.:120; I.V.:124] Out: 400 [Urine:400]  Labs:  [facility] 05/27/12 0400 05/26/12 0417 05/25/12 0415  WBC 11.5* 12.5* 10.9*  HGB 7.5* 7.8* 8.0*  PLT 271 282 269  LABCREA -- -- --  CREATININE 0.35* 0.32* 0.28*   Estimated Creatinine Clearance: 52 ml/min (by C-G formula based on Cr of 0.35).  Basename 05/27/12 1430 05/25/12 1400  VANCOTROUGH 13.4 <5.0*  VANCOPEAK -- --  Drue Dun -- --  GENTTROUGH -- --  GENTPEAK -- --  GENTRANDOM -- --  TOBRATROUGH -- --  TOBRAPEAK -- --  TOBRARND -- --  AMIKACINPEAK -- --  AMIKACINTROU -- --  AMIKACIN -- --     Microbiology: Recent Results (from the past 720 hour(s))  CULTURE, RESPIRATORY     Status: Normal   Collection Time   05/01/12  2:12 PM      Component Value Range Status Comment   Specimen Description BRONCHIAL WASHINGS   Final    Special Requests NONE   Final    Gram Stain     Final    Value: ABUNDANT WBC PRESENT, PREDOMINANTLY PMN     NO SQUAMOUS EPITHELIAL CELLS SEEN     NO ORGANISMS SEEN   Culture Non-Pathogenic Oropharyngeal-type Flora Isolated.   Final    Report Status  05/04/2012 FINAL   Final   FUNGUS CULTURE W SMEAR     Status: Normal (Preliminary result)   Collection Time   05/01/12  2:12 PM      Component Value Range Status Comment   Specimen Description BRONCHIAL WASHINGS   Final    Special Requests NONE   Final    Fungal Smear NO YEAST OR FUNGAL ELEMENTS SEEN   Final    Culture CANDIDA ALBICANS   Final    Report Status PENDING   Incomplete   AFB CULTURE WITH SMEAR     Status: Normal (Preliminary result)   Collection Time   05/01/12  2:12 PM      Component Value Range Status Comment   Specimen Description BRONCHIAL WASHINGS   Final    Special Requests NONE   Final    ACID FAST SMEAR NO ACID FAST BACILLI SEEN   Final    Culture     Final    Value: CULTURE WILL BE EXAMINED FOR 6 WEEKS BEFORE ISSUING A FINAL REPORT   Report Status PENDING   Incomplete   SURGICAL PCR SCREEN     Status: Normal   Collection Time   05/22/12 11:14 PM      Component Value Range Status Comment   MRSA, PCR NEGATIVE  NEGATIVE Final    Staphylococcus aureus NEGATIVE  NEGATIVE Final   FUNGUS CULTURE W SMEAR     Status: Normal (Preliminary result)   Collection Time   05/23/12  8:58 AM      Component Value Range Status Comment   Specimen Description BRONCHIAL WASHINGS   Final    Special Requests NONE   Final    Fungal Smear NO YEAST OR FUNGAL ELEMENTS SEEN   Final    Culture CULTURE IN PROGRESS FOR FOUR WEEKS   Final    Report Status PENDING   Incomplete   AFB CULTURE WITH SMEAR     Status: Normal (Preliminary result)   Collection Time   05/23/12  8:58 AM      Component Value Range Status Comment   Specimen Description BRONCHIAL WASHINGS   Final    Special Requests NONE   Final    ACID FAST SMEAR NO ACID FAST BACILLI SEEN   Final    Culture     Final    Value: CULTURE WILL BE EXAMINED FOR 6 WEEKS BEFORE ISSUING A FINAL REPORT   Report Status PENDING   Incomplete   GRAM STAIN     Status: Normal   Collection Time   05/23/12  8:58 AM      Component Value Range Status  Comment   Specimen Description BRONCHIAL WASHINGS   Final    Special Requests NONE   Final    Gram Stain     Final    Value: ABUNDANT WBC PRESENT, PREDOMINANTLY PMN     FEW GRAM POSITIVE COCCI IN PAIRS     CALLED TO Andrey Campanile D RN 05/23/12 0935 COSTELLO B   Report Status 05/23/2012 FINAL   Final   CULTURE, RESPIRATORY     Status: Normal   Collection Time   05/23/12  8:58 AM      Component Value Range Status Comment   Specimen Description BRONCHIAL WASHINGS   Final    Special Requests NONE RIGHT LUNG   Final    Gram Stain     Final    Value: ABUNDANT WBC PRESENT, PREDOMINANTLY PMN     FEW GRAM POSITIVE COCCI IN PAIRS     Gram Stain Report Called to,Read Back By and Verified With: Gram Stain Report Called to,Read Back By and Verified With:  Elby Showers RN @09 :36 ON 05/23/12 BY COSTELLO B   Culture Non-Pathogenic Oropharyngeal-type Flora Isolated.   Final    Report Status 05/25/2012 FINAL   Final   WOUND CULTURE     Status: Normal   Collection Time   05/23/12 10:39 AM      Component Value Range Status Comment   Specimen Description WOUND RIGHT CHEST   Final    Special Requests SWAB OF THE UPPER CHEST CAVITY    Final    Gram Stain     Final    Value: ABUNDANT WBC PRESENT,BOTH PMN AND MONONUCLEAR     RARE SQUAMOUS EPITHELIAL CELLS PRESENT     RARE GRAM POSITIVE COCCI IN PAIRS   Culture NO GROWTH 2 DAYS   Final    Report Status 05/25/2012 FINAL   Final   ANAEROBIC CULTURE     Status: Normal (Preliminary result)   Collection Time   05/23/12 10:39 AM      Component Value Range Status Comment   Specimen Description WOUND RIGHT CHEST   Final    Special Requests SWAB OF THE UPPER CHEST CAVITY   Final    Gram Stain     Final  Value: ABUNDANT WBC PRESENT,BOTH PMN AND MONONUCLEAR     RARE SQUAMOUS EPITHELIAL CELLS PRESENT     RARE GRAM POSITIVE COCCI IN PAIRS   Culture     Final    Value: NO ANAEROBES ISOLATED; CULTURE IN PROGRESS FOR 5 DAYS   Report Status PENDING   Incomplete     Anti-infectives      Start     Dose/Rate Route Frequency Ordered Stop   05/26/12 0300   vancomycin (VANCOCIN) IVPB 1000 mg/200 mL premix        1,000 mg 200 mL/hr over 60 Minutes Intravenous Every 12 hours 05/25/12 1510     05/25/12 1630   vancomycin (VANCOCIN) IVPB 1000 mg/200 mL premix        1,000 mg 200 mL/hr over 60 Minutes Intravenous Every 12 hours 05/25/12 1621 05/25/12 1721   05/22/12 1500   vancomycin (VANCOCIN) IVPB 1000 mg/200 mL premix  Status:  Discontinued        1,000 mg 200 mL/hr over 60 Minutes Intravenous Every 24 hours 05/22/12 1413 05/25/12 1510   05/21/12 1400   imipenem-cilastatin (PRIMAXIN) 500 mg in sodium chloride 0.9 % 100 mL IVPB  Status:  Discontinued        500 mg 200 mL/hr over 30 Minutes Intravenous 3 times per day 05/21/12 1136 05/21/12 1141   05/21/12 1400   imipenem-cilastatin (PRIMAXIN) 250 mg in sodium chloride 0.9 % 100 mL IVPB        250 mg 200 mL/hr over 30 Minutes Intravenous 3 times per day 05/21/12 1141     05/21/12 1300   micafungin (MYCAMINE) 100 mg in sodium chloride 0.9 % 100 mL IVPB  Status:  Discontinued        100 mg 100 mL/hr over 1 Hours Intravenous Daily 05/21/12 1136 05/25/12 1041          Assessment: 57 yo with lung abscess is currently on subtherapeutic vancomycin .  Vancomycin trough was 13.4  Goal of Therapy:  Vancomycin trough level 15-20 mcg/ml  Plan:  1) Increase vancomycin to 1250mg  iv q12h 2) Reck trough at steady staet.  Faryal Marxen, Tsz-Yin 05/27/2012,3:16 PM

## 2012-05-28 ENCOUNTER — Encounter (HOSPITAL_COMMUNITY): Payer: Self-pay | Admitting: Cardiothoracic Surgery

## 2012-05-28 ENCOUNTER — Inpatient Hospital Stay (HOSPITAL_COMMUNITY): Payer: Medicaid Other

## 2012-05-28 DIAGNOSIS — J95821 Acute postprocedural respiratory failure: Secondary | ICD-10-CM

## 2012-05-28 LAB — BASIC METABOLIC PANEL
BUN: 14 mg/dL (ref 6–23)
CO2: 34 mEq/L — ABNORMAL HIGH (ref 19–32)
Chloride: 99 mEq/L (ref 96–112)
Creatinine, Ser: 0.74 mg/dL (ref 0.50–1.10)
Glucose, Bld: 99 mg/dL (ref 70–99)

## 2012-05-28 LAB — ANAEROBIC CULTURE

## 2012-05-28 LAB — FUNGUS CULTURE W SMEAR: Fungal Smear: NONE SEEN

## 2012-05-28 LAB — CBC
HCT: 26.8 % — ABNORMAL LOW (ref 36.0–46.0)
Hemoglobin: 8.7 g/dL — ABNORMAL LOW (ref 12.0–15.0)
MCV: 88.7 fL (ref 78.0–100.0)
RBC: 3.02 MIL/uL — ABNORMAL LOW (ref 3.87–5.11)
WBC: 8.7 10*3/uL (ref 4.0–10.5)

## 2012-05-28 LAB — TYPE AND SCREEN: Unit division: 0

## 2012-05-28 MED ORDER — MAGNESIUM HYDROXIDE 400 MG/5ML PO SUSP
30.0000 mL | Freq: Every evening | ORAL | Status: DC | PRN
  Administered 2012-05-28 – 2012-05-30 (×3): 30 mL via ORAL
  Filled 2012-05-28 (×3): qty 30

## 2012-05-28 MED ORDER — ALBUTEROL SULFATE (5 MG/ML) 0.5% IN NEBU: 2.5000 mg | INHALATION_SOLUTION | RESPIRATORY_TRACT | Status: DC | PRN

## 2012-05-28 MED ORDER — ENSURE COMPLETE PO LIQD
237.0000 mL | Freq: Two times a day (BID) | ORAL | Status: DC
  Administered 2012-05-28 – 2012-05-31 (×5): 237 mL via ORAL

## 2012-05-28 NOTE — Progress Notes (Signed)
Patient examined and record reviewed.Hemodynamics stable,labs satisfactory.Patient had stable day.Continue current care. VAN TRIGT III,PETER 05/28/2012

## 2012-05-28 NOTE — Progress Notes (Signed)
Patient ID: [name] [name], female   DOB: 1955/09/28, 57 y.o.   MRN: [id number] TCTS DAILY PROGRESS NOTE                   [address]            [place] [address]          [phone]      5 Days Post-Op Procedure(s) (LRB): VIDEO BRONCHOSCOPY (N/A) VIDEO ASSISTED THORACOSCOPY (Right) ELOESSOR PROCEDURE (Right) THORACOTOMY MAJOR (Right)  Total Length of Stay:  LOS: 7 days   Subjective: Ambulating better. Taking po diet well  Objective: Vital signs in last 24 hours: Temp:  [97.3 F (36.3 C)-97.9 F (36.6 C)] 97.6 F (36.4 C) (01/13 0200) Pulse Rate:  [64-93] 76  (01/13 0700) Cardiac Rhythm:  [-] Normal sinus rhythm (01/13 0700) Resp:  [15-25] 20  (01/13 0700) BP: (85-127)/(59-98) 92/73 mmHg (01/13 0700) SpO2:  [86 %-100 %] 97 % (01/13 0700) Weight:  [93 lb 7.6 oz (42.4 kg)] 93 lb 7.6 oz (42.4 kg) (01/13 0600)  Filed Weights   05/26/12 0500 05/27/12 0600 05/28/12 0600  Weight: 91 lb 6.4 oz (41.459 kg) 90 lb 6.2 oz (41 kg) 93 lb 7.6 oz (42.4 kg)    Weight change: 3 lb 1.4 oz (1.4 kg)   Hemodynamic parameters for last 24 hours:    Intake/Output from previous day: 01/12 0701 - 01/13 0700 In: 2109 [P.O.:300; I.V.:521.5; Blood:387.5; IV Piggyback:900] Out: 2725 [Urine:2725]  Intake/Output this shift:    Current Meds: Scheduled Meds:   . ipratropium  0.5 mg Nebulization Q6H   And  . albuterol  2.5 mg Nebulization Q6H  . ALPRAZolam  1.5 mg Oral TID  . bisacodyl  10 mg Oral Daily  . budesonide  0.5 mg Nebulization BID  . enoxaparin  20 mg Subcutaneous Q24H  . feeding supplement  1 Container Oral TID BM  . fentaNYL   Intravenous Q4H  . guaiFENesin  600 mg Oral BID  . imipenem-cilastatin  250 mg Intravenous Q8H  . multivitamin with minerals  1 tablet Oral Daily  . nicotine  14 mg Transdermal Daily  . pantoprazole  40 mg Oral Q1200  . vancomycin  1,250 mg Intravenous Q12H   Continuous Infusions:   . dextrose 5 % and 0.9 % NaCl with KCl 20  mEq/L 20 mL/hr at 05/27/12 0719   PRN Meds:.albuterol, diphenhydrAMINE, diphenhydrAMINE, ketorolac, levalbuterol, naloxone, ondansetron (ZOFRAN) IV, ondansetron (ZOFRAN) IV, potassium chloride, senna-docusate, sodium chloride, traMADol  General appearance: alert, cooperative and no distress Neurologic: intact Heart: regular rate and rhythm, S1, S2 normal, no murmur, click, rub or gallop and normal apical impulse Lungs: diminished breath sounds RML and RUL Abdomen: soft, non-tender; bowel sounds normal; no masses,  no organomegaly Extremities: extremities normal, atraumatic, no cyanosis or edema  Lab Results: CBC: Basename 05/28/12 0400 05/27/12 0400  WBC 8.7 11.5*  HGB 8.7* 7.5*  HCT 26.8* 23.6*  PLT 256 271   BMET:  Basename 05/28/12 0400 05/27/12 0400  NA 138 134*  K 4.5 3.8  CL 99 92*  CO2 34* 36*  GLUCOSE 99 103*  BUN 14 6  CREATININE 0.74 0.35*  CALCIUM 9.4 9.3    PT/INR: No results found for this basename: LABPROT,INR in the last 72 hours Radiology: Dg Chest Port 1 View  05/28/2012  *RADIOLOGY REPORT*  Clinical Data: Chronic right lung infection post bronchoscopy and thoracoscopy.  PORTABLE CHEST - 1 VIEW  Comparison: 05/26/2012  Findings:  Postoperative changes in the right lung with partial resection and associated volume loss.  Old right rib fractures likely representing post thoracotomy change.  There is right pleural effusion with lucency in the right chest inferiorly suggesting either pneumothorax or pleural gas.  This could represent postoperative change or infection.  Skin clips in the right chest wall.  Subcutaneous emphysema bilaterally.  Heart size and pulmonary vascularity appear normal.  There is diffuse interstitial change in the left lung suggesting fibrosis.  There is developing superimposed focal infiltration in the left mid lung since previous study.  Central venous catheters remain unchanged in position.  IMPRESSION: Stable appearance of the right chest,  demonstrating postoperative changes, pleural effusion, pneumothorax versus pleural gas.  A developing focal infiltration in the left mid lung.   Original Report Authenticated By: Burman Nieves, M.D.      Assessment/Plan: S/P Procedure(s) (LRB): VIDEO BRONCHOSCOPY (N/A) VIDEO ASSISTED THORACOSCOPY (Right) ELOESSOR PROCEDURE (Right) THORACOTOMY MAJOR (Right) Mobilize Diuresis Continue ABX therapy due to preop infection Plan for transfer to step-down: see transfer orders     Shekita Boyden B 05/28/2012 7:37 AM

## 2012-05-28 NOTE — Progress Notes (Signed)
PULMONARY  / CRITICAL CARE MEDICINE  Name: [name] [name] MRN: [id number] DOB: 12-04-1955    LOS: 7  REFERRING PROVIDER:   Tyrone Sage   CHIEF COMPLAINT:   Lung Abscess  BRIEF PATIENT DESCRIPTION:  57 year old active smoker w/ remote h/o lung CA. She is s/p RULobectomy~12 y ago. Since this time she has been treated at [facility] and [facility] for what was felt to be chronic pleural space infection. She presented to [facility] on 12/17 for elective bronchoscopy in setting of progressive wasting / cachexia which demonstrated no airway obstruction but significant pulmonary secretions.  She discharged AMA prior eval being completed.  Followed up in office with Dr. Tyrone Sage 1/2 and was re-admitted 1/6 for planned completion pneumonectomy / drainage of infected R pleural space.   12/13 FEV1 1.10 (39%) & DLCO 20%   LINES / TUBES: LUE PICC 1/6 >> oett 1/8>>>1/8  CULTURES: reviewed  ANTIBIOTICS:  per ID   SIGNIFICANT EVENTS:  12/17 - Admit for bronch in setting of progressive wasting, neg for tumor but significant secretions 12/31 - LEFT AMA 1/6 -     Re-admit from CVTS office for further work up - pneumonectomy / drainage of pleural space infection S/P VATS w/ debridement and drainage of right lung cavity and Right Eloessor procedure 1/8 1/8 extubated  INTERVAL HISTORY:   Resting without complaints.     VITAL SIGNS: Temp:  [97.5 F (36.4 C)-97.9 F (36.6 C)] 97.5 F (36.4 C) (01/13 1517) Pulse Rate:  [69-92] 77  (01/13 1800) Resp:  [15-23] 18  (01/13 1800) BP: (90-116)/(58-79) 108/71 mmHg (01/13 1800) SpO2:  [91 %-100 %] 97 % (01/13 1800) FiO2 (%):  [0 %] 0 % (01/13 0751) Weight:  [42.4 kg (93 lb 7.6 oz)] 42.4 kg (93 lb 7.6 oz) (01/13 0600)  PHYSICAL EXAMINATION: General:  Frail, cachectic female, Sitting in chair. Psych: looks anxious Neuro:  Awake and oriented  HEENT: mm pink/moist Cardiovascular:  rrr Lungs:  No wheeze, decreased on right Abdomen:  Soft, non-tender    Musculoskeletal:  Intact Skin:  Intact    Lab 05/28/12 0400 05/27/12 0400 05/26/12 0417  NA 138 134* 136  K 4.5 3.8 4.3  CL 99 92* 95*  CO2 34* 36* 37*  BUN 14 6 4*  CREATININE 0.74 0.35* 0.32*  GLUCOSE 99 103* 114*    Lab 05/28/12 0400 05/27/12 0400 05/26/12 0417  HGB 8.7* 7.5* 7.8*  HCT 26.8* 23.6* 25.1*  WBC 8.7 11.5* 12.5*  PLT 256 271 282   Dg Chest Port 1 View  05/28/2012  *RADIOLOGY REPORT*  Clinical Data: Chronic right lung infection post bronchoscopy and thoracoscopy.  PORTABLE CHEST - 1 VIEW  Comparison: 05/26/2012  Findings: Postoperative changes in the right lung with partial resection and associated volume loss.  Old right rib fractures likely representing post thoracotomy change.  There is right pleural effusion with lucency in the right chest inferiorly suggesting either pneumothorax or pleural gas.  This could represent postoperative change or infection.  Skin clips in the right chest wall.  Subcutaneous emphysema bilaterally.  Heart size and pulmonary vascularity appear normal.  There is diffuse interstitial change in the left lung suggesting fibrosis.  There is developing superimposed focal infiltration in the left mid lung since previous study.  Central venous catheters remain unchanged in position.  IMPRESSION: Stable appearance of the right chest, demonstrating postoperative changes, pleural effusion, pneumothorax versus pleural gas.  A developing focal infiltration in the left mid lung.   Original Report  Authenticated By: Burman Nieves, M.D.     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  After 5:30 PM or weekends, call [phone]

## 2012-05-28 NOTE — Progress Notes (Signed)
INFECTIOUS DISEASE PROGRESS NOTE  ID: [name] [name] is a 57 y.o. female with   Active Problems:  COPD (chronic obstructive pulmonary disease)  Subjective: Without complaints. Normal BM  Abtx:  Anti-infectives     Start     Dose/Rate Route Frequency Ordered Stop   05/27/12 1600   vancomycin (VANCOCIN) 1,250 mg in sodium chloride 0.9 % 250 mL IVPB        1,250 mg 166.7 mL/hr over 90 Minutes Intravenous Every 12 hours 05/27/12 1518     05/26/12 0300   vancomycin (VANCOCIN) IVPB 1000 mg/200 mL premix  Status:  Discontinued        1,000 mg 200 mL/hr over 60 Minutes Intravenous Every 12 hours 05/25/12 1510 05/27/12 1518   05/25/12 1630   vancomycin (VANCOCIN) IVPB 1000 mg/200 mL premix        1,000 mg 200 mL/hr over 60 Minutes Intravenous Every 12 hours 05/25/12 1621 05/25/12 1721   05/22/12 1500   vancomycin (VANCOCIN) IVPB 1000 mg/200 mL premix  Status:  Discontinued        1,000 mg 200 mL/hr over 60 Minutes Intravenous Every 24 hours 05/22/12 1413 05/25/12 1510   05/21/12 1400   imipenem-cilastatin (PRIMAXIN) 500 mg in sodium chloride 0.9 % 100 mL IVPB  Status:  Discontinued        500 mg 200 mL/hr over 30 Minutes Intravenous 3 times per day 05/21/12 1136 05/21/12 1141   05/21/12 1400   imipenem-cilastatin (PRIMAXIN) 250 mg in sodium chloride 0.9 % 100 mL IVPB        250 mg 200 mL/hr over 30 Minutes Intravenous 3 times per day 05/21/12 1141     05/21/12 1300   micafungin (MYCAMINE) 100 mg in sodium chloride 0.9 % 100 mL IVPB  Status:  Discontinued        100 mg 100 mL/hr over 1 Hours Intravenous Daily 05/21/12 1136 05/25/12 1041          Medications:  Scheduled:   . ipratropium  0.5 mg Nebulization Q6H   And  . albuterol  2.5 mg Nebulization Q6H  . ALPRAZolam  1.5 mg Oral TID  . bisacodyl  10 mg Oral Daily  . budesonide  0.5 mg Nebulization BID  . enoxaparin  20 mg Subcutaneous Q24H  . feeding supplement  1 Container Oral TID BM  . guaiFENesin  600 mg Oral BID   . imipenem-cilastatin  250 mg Intravenous Q8H  . multivitamin with minerals  1 tablet Oral Daily  . nicotine  14 mg Transdermal Daily  . pantoprazole  40 mg Oral Q1200  . vancomycin  1,250 mg Intravenous Q12H    Objective: Vital signs in last 24 hours: Temp:  [97.3 F (36.3 C)-97.9 F (36.6 C)] 97.7 F (36.5 C) (01/13 1122) Pulse Rate:  [68-93] 78  (01/13 1100) Resp:  [15-25] 18  (01/13 1100) BP: (90-127)/(59-98) 107/67 mmHg (01/13 1100) SpO2:  [93 %-100 %] 98 % (01/13 1100) FiO2 (%):  [0 %] 0 % (01/13 0751) Weight:  [42.4 kg (93 lb 7.6 oz)] 42.4 kg (93 lb 7.6 oz) (01/13 0600)   General appearance: alert, cooperative and no distress Resp: diminished breath sounds R > L , rhonchi anterior - left and rubs bilaterally Cardio: regular rate and rhythm GI: normal findings: bowel sounds normal and soft, non-tender  Lab Results  Basename 05/28/12 0400 05/27/12 0400  WBC 8.7 11.5*  HGB 8.7* 7.5*  HCT 26.8* 23.6*  NA 138 134*  K  4.5 3.8  CL 99 92*  CO2 34* 36*  BUN 14 6  CREATININE 0.74 0.35*  GLU -- --   Liver Panel  Basename 05/27/12 0400  PROT 6.9  ALBUMIN 2.0*  AST 9  ALT 6  ALKPHOS 66  BILITOT 0.1*  BILIDIR --  IBILI --   Sedimentation Rate No results found for this basename: ESRSEDRATE in the last 72 hours C-Reactive Protein No results found for this basename: CRP:2 in the last 72 hours  Microbiology: Recent Results (from the past 240 hour(s))  SURGICAL PCR SCREEN     Status: Normal   Collection Time   05/22/12 11:14 PM      Component Value Range Status Comment   MRSA, PCR NEGATIVE  NEGATIVE Final    Staphylococcus aureus NEGATIVE  NEGATIVE Final   FUNGUS CULTURE W SMEAR     Status: Normal (Preliminary result)   Collection Time   05/23/12  8:58 AM      Component Value Range Status Comment   Specimen Description BRONCHIAL WASHINGS   Final    Special Requests NONE   Final    Fungal Smear NO YEAST OR FUNGAL ELEMENTS SEEN   Final    Culture CULTURE IN  PROGRESS FOR FOUR WEEKS   Final    Report Status PENDING   Incomplete   AFB CULTURE WITH SMEAR     Status: Normal (Preliminary result)   Collection Time   05/23/12  8:58 AM      Component Value Range Status Comment   Specimen Description BRONCHIAL WASHINGS   Final    Special Requests NONE   Final    ACID FAST SMEAR NO ACID FAST BACILLI SEEN   Final    Culture     Final    Value: CULTURE WILL BE EXAMINED FOR 6 WEEKS BEFORE ISSUING A FINAL REPORT   Report Status PENDING   Incomplete   GRAM STAIN     Status: Normal   Collection Time   05/23/12  8:58 AM      Component Value Range Status Comment   Specimen Description BRONCHIAL WASHINGS   Final    Special Requests NONE   Final    Gram Stain     Final    Value: ABUNDANT WBC PRESENT, PREDOMINANTLY PMN     FEW GRAM POSITIVE COCCI IN PAIRS     CALLED TO Andrey Campanile D RN 05/23/12 0935 COSTELLO B   Report Status 05/23/2012 FINAL   Final   CULTURE, RESPIRATORY     Status: Normal   Collection Time   05/23/12  8:58 AM      Component Value Range Status Comment   Specimen Description BRONCHIAL WASHINGS   Final    Special Requests NONE RIGHT LUNG   Final    Gram Stain     Final    Value: ABUNDANT WBC PRESENT, PREDOMINANTLY PMN     FEW GRAM POSITIVE COCCI IN PAIRS     Gram Stain Report Called to,Read Back By and Verified With: Gram Stain Report Called to,Read Back By and Verified With:  Elby Showers RN @09 :36 ON 05/23/12 BY COSTELLO B   Culture Non-Pathogenic Oropharyngeal-type Flora Isolated.   Final    Report Status 05/25/2012 FINAL   Final   WOUND CULTURE     Status: Normal   Collection Time   05/23/12 10:39 AM      Component Value Range Status Comment   Specimen Description WOUND RIGHT CHEST   Final  Special Requests SWAB OF THE UPPER CHEST CAVITY    Final    Gram Stain     Final    Value: ABUNDANT WBC PRESENT,BOTH PMN AND MONONUCLEAR     RARE SQUAMOUS EPITHELIAL CELLS PRESENT     RARE GRAM POSITIVE COCCI IN PAIRS   Culture NO GROWTH 2 DAYS   Final     Report Status 05/25/2012 FINAL   Final   ANAEROBIC CULTURE     Status: Normal (Preliminary result)   Collection Time   05/23/12 10:39 AM      Component Value Range Status Comment   Specimen Description WOUND RIGHT CHEST   Final    Special Requests SWAB OF THE UPPER CHEST CAVITY   Final    Gram Stain     Final    Value: ABUNDANT WBC PRESENT,BOTH PMN AND MONONUCLEAR     RARE SQUAMOUS EPITHELIAL CELLS PRESENT     RARE GRAM POSITIVE COCCI IN PAIRS   Culture     Final    Value: NO ANAEROBES ISOLATED; CULTURE IN PROGRESS FOR 5 DAYS   Report Status PENDING   Incomplete     Studies/Results: Dg Chest Port 1 View  05/28/2012  *RADIOLOGY REPORT*  Clinical Data: Chronic right lung infection post bronchoscopy and thoracoscopy.  PORTABLE CHEST - 1 VIEW  Comparison: 05/26/2012  Findings: Postoperative changes in the right lung with partial resection and associated volume loss.  Old right rib fractures likely representing post thoracotomy change.  There is right pleural effusion with lucency in the right chest inferiorly suggesting either pneumothorax or pleural gas.  This could represent postoperative change or infection.  Skin clips in the right chest wall.  Subcutaneous emphysema bilaterally.  Heart size and pulmonary vascularity appear normal.  There is diffuse interstitial change in the left lung suggesting fibrosis.  There is developing superimposed focal infiltration in the left mid lung since previous study.  Central venous catheters remain unchanged in position.  IMPRESSION: Stable appearance of the right chest, demonstrating postoperative changes, pleural effusion, pneumothorax versus pleural gas.  A developing focal infiltration in the left mid lung.   Original Report Authenticated By: Burman Nieves, M.D.      Assessment/Plan: Lung Abscess  Bipolar d/o  COPD  Total days of antibiotics 7 (vanco/imipenem)  Her Cx are (-), unrevealing.  States she has 28 days of hospital/SNF potential. Would  plan for her to get 28 days of vanco/imipenem (if d/c before day 28, can change to vanco/invanz). Afterwards consider f/u imaging, po anbx. Would be glad to see her in ID clinic in f/u.    [name] Infectious Diseases [phone] 05/28/2012, 11:49 AM   LOS: 7 days

## 2012-05-28 NOTE — Progress Notes (Signed)
Report called to [id number], pt transferred to [id number] via wheelchair without difficulty.

## 2012-05-28 NOTE — Progress Notes (Signed)
NUTRITION FOLLOW UP   DOCUMENTATION CODES  Per approved criteria   -Severe malnutrition in the context of chronic illness  -Underweight    Intervention:    Chocolate Ensure Complete twice daily (350 kcals, 13 gm protein per 8 fl oz bottle) RD to follow for nutrition care plan  Nutrition Dx:   Increased nutrient needs related to post-op healing as evidenced by estimated nutrition needs, ongoing  Goal:   Oral intake with meals & supplements to meet >/= 90% of estimated nutrition needs, progressing  Monitor:   PO & supplemental intake, weight, labs, I/O's  Assessment:   Patient s/p procedures 1/8:  VIDEO BRONCHOSCOPY, RIGHT  VIDEO ASSISTED THORACOSCOPY  RIGHT THORACOTOMY  DEBRIDEMENT AND DRAINAGE of RIGHT LUNG CAVITY  RIGHT ELOESSOR PROCEDURE   Patient receiving bath with RD visitation.  PO intake 50-75% per flowsheet records.  Diet advanced to Regular from [employer] 1/11.  RD to discontinue [employer] and add chocolate Ensure Complete supplements.  Height: Ht Readings from Last 1 Encounters:  05/21/12 5\' 5"  (1.651 m)    Weight Status:   Wt Readings from Last 1 Encounters:  05/28/12 93 lb 7.6 oz (42.4 kg)    Re-estimated needs:  Kcal: 1400-1600 Protein: 65-75 gm Fluid: > 1.5 L  Skin: chest surgical incision   Diet Order: General   Intake/Output Summary (Last 24 hours) at 05/28/12 1247 Last data filed at 05/28/12 1000  Gross per 24 hour  Intake   2575 ml  Output   2625 ml  Net    -50 ml    Labs:   Lab 05/28/12 0400 05/27/12 0400 05/26/12 0417  NA 138 134* 136  K 4.5 3.8 4.3  CL 99 92* 95*  CO2 34* 36* 37*  BUN 14 6 4*  CREATININE 0.74 0.35* 0.32*  CALCIUM 9.4 9.3 9.6  MG -- -- --  PHOS -- -- --  GLUCOSE 99 103* 114*    CBG (last 3)  No results found for this basename: GLUCAP:3 in the last 72 hours  Scheduled Meds:   . ipratropium  0.5 mg Nebulization Q6H   And  . albuterol  2.5 mg Nebulization Q6H  . ALPRAZolam  1.5 mg Oral TID    . bisacodyl  10 mg Oral Daily  . budesonide  0.5 mg Nebulization BID  . enoxaparin  20 mg Subcutaneous Q24H  . feeding supplement  237 mL Oral BID BM  . guaiFENesin  600 mg Oral BID  . imipenem-cilastatin  250 mg Intravenous Q8H  . multivitamin with minerals  1 tablet Oral Daily  . nicotine  14 mg Transdermal Daily  . pantoprazole  40 mg Oral Q1200  . vancomycin  1,250 mg Intravenous Q12H    Continuous Infusions:   . dextrose 5 % and 0.9 % NaCl with KCl 20 mEq/L 20 mL/hr at 05/27/12 0719    Maureen Chatters, RD, LDN Pager #: [phone] After-Hours Pager #: [phone]

## 2012-05-29 ENCOUNTER — Inpatient Hospital Stay (HOSPITAL_COMMUNITY): Payer: Medicaid Other

## 2012-05-29 DIAGNOSIS — F17200 Nicotine dependence, unspecified, uncomplicated: Secondary | ICD-10-CM

## 2012-05-29 LAB — BASIC METABOLIC PANEL
BUN: 14 mg/dL (ref 6–23)
CO2: 32 mEq/L (ref 19–32)
Calcium: 8.1 mg/dL — ABNORMAL LOW (ref 8.4–10.5)
Chloride: 102 mEq/L (ref 96–112)
Creatinine, Ser: 0.56 mg/dL (ref 0.50–1.10)
GFR calc Af Amer: >90 mL/min (ref >90)
GFR calc non Af Amer: >90 mL/min (ref >90)
Glucose, Bld: 76 mg/dL (ref 70–99)
Potassium: 4.2 mEq/L (ref 3.5–5.1)
Sodium: 140 mEq/L (ref 135–145)

## 2012-05-29 LAB — CBC
HCT: 24.4 % — ABNORMAL LOW (ref 36.0–46.0)
Hemoglobin: 7.8 g/dL — ABNORMAL LOW (ref 12.0–15.0)
MCH: 29 pg (ref 26.0–34.0)
MCHC: 32 g/dL (ref 30.0–36.0)
MCV: 90.7 fL (ref 78.0–100.0)
Platelets: 247 10*3/uL (ref 150–400)
RBC: 2.69 MIL/uL — ABNORMAL LOW (ref 3.87–5.11)
RDW: 15.9 % — ABNORMAL HIGH (ref 11.5–15.5)
WBC: 9.7 10*3/uL (ref 4.0–10.5)

## 2012-05-29 MED ORDER — SODIUM CHLORIDE 0.9 % IJ SOLN
10.0000 mL | Freq: Two times a day (BID) | INTRAMUSCULAR | Status: DC
  Administered 2012-05-29 – 2012-05-31 (×5): 10 mL
  Administered 2012-05-31: 20 mL
  Filled 2012-05-29: qty 20
  Filled 2012-05-29: qty 30
  Filled 2012-05-29: qty 20

## 2012-05-29 MED ORDER — POLYSACCHARIDE IRON COMPLEX 150 MG PO CAPS
150.0000 mg | ORAL_CAPSULE | Freq: Every day | ORAL | Status: DC
  Administered 2012-05-29 – 2012-06-01 (×4): 150 mg via ORAL
  Filled 2012-05-29 (×4): qty 1

## 2012-05-29 MED ORDER — SODIUM CHLORIDE 0.9 % IJ SOLN
10.0000 mL | INTRAMUSCULAR | Status: DC | PRN
  Filled 2012-05-29 (×2): qty 10

## 2012-05-29 MED ORDER — ONDANSETRON HCL 4 MG/2ML IJ SOLN
4.0000 mg | Freq: Four times a day (QID) | INTRAMUSCULAR | Status: DC | PRN
  Administered 2012-06-01: 4 mg via INTRAVENOUS
  Filled 2012-05-29 (×3): qty 2

## 2012-05-29 MED ORDER — FOLIC ACID 1 MG PO TABS
1.0000 mg | ORAL_TABLET | Freq: Every day | ORAL | Status: DC
  Administered 2012-05-29 – 2012-06-01 (×4): 1 mg via ORAL
  Filled 2012-05-29 (×5): qty 1

## 2012-05-29 MED ORDER — POLYETHYLENE GLYCOL 3350 17 G PO PACK
17.0000 g | PACK | Freq: Once | ORAL | Status: AC
  Administered 2012-05-29: 17 g via ORAL
  Filled 2012-05-29: qty 1

## 2012-05-29 NOTE — Clinical Social Work Placement (Addendum)
    Clinical Social Work Department CLINICAL SOCIAL WORK PLACEMENT NOTE 05/29/2012  Patient:  [name] [name], [name] [name]  Account Number:  [id number] Admit date:  05/21/2012  Clinical Social Worker:  Theresia Bough, Theresia Majors  Date/time:  05/29/2012 10:49 AM  Clinical Social Work is seeking post-discharge placement for this patient at the following level of care:   SKILLED NURSING   (*CSW will update this form in Epic as items are completed)   05/29/2012  Patient/family provided with [facility] Department of Clinical Social Work's list of facilities offering this level of care within the geographic area requested by the patient (or if unable, by the patient's family).  05/29/2012  Patient/family informed of their freedom to choose among providers that offer the needed level of care, that participate in Medicare, Medicaid or managed care program needed by the patient, have an available bed and are willing to accept the patient.  05/29/2012  Patient/family informed of [facility]' ownership interest in [facility], as well as of the fact that they are under no obligation to receive care at this facility.  PASARR submitted to EDS on 05/29/2012 PASARR number received from [employer] on 05/30/11 [phone] [id number]  FL2 transmitted to all facilities in geographic area requested by pt/family on  05/29/2012 FL2 transmitted to all facilities within larger geographic area on   Patient informed that his/her managed care company has contracts with or will negotiate with  certain facilities, including the following:     Patient/family informed of bed offers received:   Patient chooses bed at  Physician recommends and patient chooses bed at    Patient to be transferred to  on   Patient to be transferred to facility by   The following physician request were entered in Epic:   Additional Comments:

## 2012-05-29 NOTE — Progress Notes (Signed)
Physical Therapy Treatment Patient Details Name: [name] [name] MRN: [id number] DOB: [birth year] Today's Date: 05/29/2012 Time: 4696-2952 PT Time Calculation (min): 33 min  PT Assessment / Plan / Recommendation Comments on Treatment Session  Pt able to increase ambulation distance and less assistance needed for overall mobility.  Pt with anxiety about d/c home and wishes to go to SNF.      Follow Up Recommendations  SNF     Equipment Recommendations  Rolling walker with 5" wheels    Frequency Min 3X/week   Plan Discharge plan remains appropriate;Frequency remains appropriate    Precautions / Restrictions Precautions Precautions: Fall   Pertinent Vitals/Pain C/o left flank pain but does not rate    Mobility  Bed Mobility Bed Mobility: Supine to Sit;Sit to Supine Supine to Sit: 4: Min guard Sit to Supine: 4: Min guard Details for Bed Mobility Assistance: minguard for safety with cues due to lines Transfers Transfers: Sit to Stand;Stand to Sit Sit to Stand: 4: Min guard;From bed Stand to Sit: 4: Min guard;To chair/3-in-1 Details for Transfer Assistance: Minguard for safety with cues for hand placement. Ambulation/Gait Ambulation/Gait Assistance: 4: Min guard Ambulation Distance (Feet): 350 Feet Assistive device: Other (Comment) (pushing w/c due to equipment) Ambulation/Gait Assistance Details: VCs for proper body position and upright posture. Gait Pattern: Step-through pattern;Decreased stride length;Trunk flexed;Narrow base of support Stairs: No Wheelchair Mobility Wheelchair Mobility: No    Exercises     PT Diagnosis:    PT Problem List:   PT Treatment Interventions:     PT Goals Acute Rehab PT Goals PT Goal Formulation: With patient Time For Goal Achievement: 06/09/12 Potential to Achieve Goals: Good Pt will go Sit to Stand: with modified independence PT Goal: Sit to Stand - Progress: Progressing toward goal Pt will go Stand to Sit: with modified  independence PT Goal: Stand to Sit - Progress: Progressing toward goal Pt will Ambulate: >150 feet;with modified independence PT Goal: Ambulate - Progress: Progressing toward goal  Visit Information  Last PT Received On: 05/29/12 Assistance Needed: +1    Subjective Data  Subjective: "I want to go to rehab because I live alone and nervous to go back home." Patient Stated Goal: Go to SNF prior to return home.   Cognition  Overall Cognitive Status: Appears within functional limits for tasks assessed/performed (Pt with impulsive tendenancies however believe at baseline) Arousal/Alertness: Awake/alert Orientation Level: Appears intact for tasks assessed Behavior During Session: Restless    Balance  Static Standing Balance Static Standing - Balance Support: No upper extremity supported Static Standing - Level of Assistance: 5: Stand by assistance  End of Session PT - End of Session Equipment Utilized During Treatment: Gait belt;Oxygen (4L) Activity Tolerance: Patient tolerated treatment well Patient left: in bed;with call bell/phone within reach Nurse Communication: Mobility status   GP     Gabrian Hoque 05/29/2012, 12:12 PM Jake Shark, PT DPT [phone]

## 2012-05-29 NOTE — Progress Notes (Signed)
Irrigated and changed back dressing with MD at bedside. Pt tolerated well. Will continue to monitor.

## 2012-05-29 NOTE — Clinical Social Work Psychosocial (Signed)
     Clinical Social Work Department BRIEF PSYCHOSOCIAL ASSESSMENT 05/29/2012  Patient:  [name] [name], [name] [name]     Account Number:  [id number]     Admit date:  05/21/2012  Clinical Social Worker:  Lourdes Sledge  Date/Time:  05/29/2012 10:38 AM  Referred by:  CSW  Date Referred:  05/29/2012 Referred for  SNF Placement   Other Referral:   Interview type:  Patient Other interview type:    PSYCHOSOCIAL DATA Living Status:  ALONE Admitted from facility:   Level of care:   Primary support name:  Renold Don [phone] & Onalee Hua [phone]) Alfredo Bach Primary support relationship to patient:  SIBLING Degree of support available:   Pt states her brothers are very supportive towards her. Pt lives alone and does not have anyone to stay with her.    CURRENT CONCERNS Current Concerns  Post-Acute Placement   Other Concerns:    SOCIAL WORK ASSESSMENT / PLAN CSW observed that PT recommended SNF placement.    CSW visited pt room and introduced herself and role. CSW informed pt of PT recommendations and explored whether pt was agreeable.Pt stated she lives alone and agrees that she needs rehab before returning home. Pt stated she feels that a SNF will be a positive experience for her. CSW explored whether pt had a facility in mind that she preferred. Pt stated she would like to be in [facility], preferrably [place] SNF. CSW informed pt that CSW would do a SNF for both [place] and [facility] per pt request. CSW informed pt of need to consider multiple facilities/counties do to pt insurance. Pt understandind and agreeable.   Assessment/plan status:  Psychosocial Support/Ongoing Assessment of Needs Other assessment/ plan:   Information/referral to community resources:   CSW provided pt with CSW contact information. CSW also provided pt with a SNF list with surrounding counties.    PATIENTS/FAMILYS RESPONSE TO PLAN OF CARE: Pt laying in bed alert and oriented. Pt had to be  redirected multiple times as she presents very anxious. Pt appreciative of visit and agreeable to SNF.

## 2012-05-29 NOTE — Progress Notes (Addendum)
                   [address]            [place] [address]          [phone]    6 Days Post-Op Procedure(s) (LRB): VIDEO BRONCHOSCOPY (N/A) VIDEO ASSISTED THORACOSCOPY (Right) ELOESSOR PROCEDURE (Right) THORACOTOMY MAJOR (Right)  Subjective: Patient with complaints of constipation  Objective: Vital signs in last 24 hours: Temp:  [97.3 F (36.3 C)-97.9 F (36.6 C)] 97.3 F (36.3 C) (01/14 0721) Pulse Rate:  [69-92] 83  (01/14 0430) Cardiac Rhythm:  [-] Normal sinus rhythm (01/14 0430) Resp:  [15-23] 17  (01/14 0430) BP: (94-122)/(58-78) 100/65 mmHg (01/14 0430) SpO2:  [90 %-98 %] 90 % (01/14 0430) FiO2 (%):  [0 %] 0 % (01/13 0751)     Intake/Output from previous day: 01/13 0701 - 01/14 0700 In: 2719 [P.O.:1380; I.V.:533; IV Piggyback:806] Out: 1700 [Urine:1700]   Physical Exam:  Cardiovascular: RRR Pulmonary: Coarse breath sounds R>L Abdomen: Soft, non tender, bowel sounds present. Extremities: No lower extremity edema. Wounds: Anterior two chest wounds are clean and dry.  No erythema or signs of infection. Will need to return to view back wound.   Lab Results: CBC: Basename 05/29/12 0440 05/28/12 0400  WBC 9.7 8.7  HGB 7.8* 8.7*  HCT 24.4* 26.8*  PLT 247 256   BMET:  Basename 05/29/12 0440 05/28/12 0400  NA 140 138  K 4.2 4.5  CL 102 99  CO2 32 34*  GLUCOSE 76 99  BUN 14 14  CREATININE 0.56 0.74  CALCIUM 8.1* 9.4    PT/INR: No results found for this basename: LABPROT,INR in the last 72 hours ABG:  INR: Will add last result for INR, ABG once components are confirmed Will add last 4 CBG results once components are confirmed  Assessment/Plan:  1. CV - SR 2.  Pulmonary - Encourage incentive spirometer. CXR this am appears to show stable post op changes on right, right pleural effusion, subcutaneous emphysema, and possible mid left lung infiltrate. Continue Vancomycin and Primaxin 3.Anemia- H and H 7.8 and 24.4 this am.  Start Nu Iron and folic acid 4.Miralax for constipation  ZIMMERMAN,[name] [name] MPA-C 05/29/2012,7:42 AM    I have seen and examined [name] [name] and agree with the above assessment  and plan.  Delight Ovens MD Beeper [phone] Office [phone] 05/29/2012 4:07 PM

## 2012-05-29 NOTE — Progress Notes (Addendum)
PULMONARY  / CRITICAL CARE MEDICINE  Name: [name] [name] MRN: [id number] DOB: [birth year]    LOS: 8  REFERRING PROVIDER:   Tyrone Sage   CHIEF COMPLAINT:   Lung Abscess  BRIEF PATIENT DESCRIPTION:  57 year old active smoker w/ remote h/o lung CA. She is s/p RULobectomy~12 y ago. Since this time she has been treated at [facility] and 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 She presented to [facility] on 12/17 for elective bronchoscopy in setting of progressive wasting / cachexia which demonstrated no airway obstruction but significant pulmonary secretions.  She discharged AMA prior eval being completed.  Followed up in office with Dr. Tyrone Sage 1/2 and was re-admitted 1/6 for planned completion pneumonectomy / drainage of infected R pleural space.   12/13 FEV1 1.10 (39%) & DLCO 20%   LINES / TUBES: LUE PICC 1/6 >> oett 1/8>>>1/8  CULTURES: reviewed  ANTIBIOTICS:  per ID   SIGNIFICANT EVENTS:  12/17 - Admit for bronch in setting of progressive wasting, neg for tumor but significant secretions 12/31 - LEFT AMA 1/6 -     Re-admit from CVTS office for further work up - pneumonectomy / drainage of pleural space infection S/P VATS w/ debridement and drainage of right lung cavity and Right Eloessor procedure 1/8 1/8 extubated 1/14 transferred to SDU  INTERVAL HISTORY:   No distress. No new complaints.     VITAL SIGNS: Temp:  [97.3 F (36.3 C)-97.8 F (36.6 C)] 97.8 F (36.6 C) (01/14 1100) Pulse Rate:  [77-88] 80  (01/14 0721) Resp:  [15-23] 16  (01/14 0721) BP: (94-122)/(58-78) 98/65 mmHg (01/14 0721) SpO2:  [90 %-97 %] 94 % (01/14 0807)  PHYSICAL EXAMINATION: General:  Frail, cachectic female, Sitting in chair. Neuro:  Awake and oriented  HEENT: mm pink/moist Cardiovascular:  rrr Lungs:  No wheeze, decreased on right Abdomen:  Soft, non-tender  Musculoskeletal:  Intact Skin:  Intact    Lab 05/29/12 0440 05/28/12 0400 05/27/12 0400  NA 140 138 134*  K  4.2 4.5 3.8  CL 102 99 92*  CO2 32 34* 36*  BUN 14 14 6   CREATININE 0.56 0.74 0.35*  GLUCOSE 76 99 103*    Lab 05/29/12 0440 05/28/12 0400 05/27/12 0400  HGB 7.8* 8.7* 7.5*  HCT 24.4* 26.8* 23.6*  WBC 9.7 8.7 11.5*  PLT 247 256 271   Dg Chest 2 View  05/29/2012  *RADIOLOGY REPORT*  Clinical Data: Post right lung surgery for chronic infection  CHEST - 2 VIEW  Comparison: 05/28/2012  Findings: Right jugular line tip projecting over SVC. Left arm PICC line tip projecting over SVC near cavoatrial junction. Normal heart size and mediastinal contours. Again identified postsurgical changes of the right thoracotomy with volume loss in right chest and mediastinal shift to the right. Right pleural effusion and scattered foci of loculated pneumothorax again identified in the right hemithorax. Underlying changes of COPD and chronic interstitial disease in left lung with superimposed acute infiltrate in the mid left lung little changed. Right chest wall emphysema extending into cervical region. Small left pleural effusion. Osseous demineralization.  IMPRESSION: Postsurgical changes right hemithorax. COPD with chronic interstitial lung disease, mid left lung infiltrate and small left pleural effusion. Little interval change.   Original Report Authenticated By: Ulyses Southward, M.D.    Dg Chest Port 1 View  05/28/2012  *RADIOLOGY REPORT*  Clinical Data: Chronic right lung infection post bronchoscopy and thoracoscopy.  PORTABLE CHEST - 1 VIEW  Comparison: 05/26/2012  Findings: Postoperative changes in the right lung with partial resection and associated volume loss.  Old right rib fractures likely representing post thoracotomy change.  There is right pleural effusion with lucency in the right chest inferiorly suggesting either pneumothorax or pleural gas.  This could represent postoperative change or infection.  Skin clips in the right chest wall.  Subcutaneous emphysema bilaterally.  Heart size and pulmonary  vascularity appear normal.  There is diffuse interstitial change in the left lung suggesting fibrosis.  There is developing superimposed focal infiltration in the left mid lung since previous study.  Central venous catheters remain unchanged in position.  IMPRESSION: Stable appearance of the right chest, demonstrating postoperative changes, pleural effusion, pneumothorax versus pleural gas.  A developing focal infiltration in the left mid lung.   Original Report Authenticated By: Burman Nieves, M.D.     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 PCCM will sign off. Please call if we can be of further assistance It would probably be appropriate for [place] to follow her after discharge. Please call to arrange follow up when she is ready for discharge   Billy Fischer, MD ; [facility] service Mobile [phone].  After 5:30 PM or weekends, call [phone]

## 2012-05-29 NOTE — Progress Notes (Signed)
Utilization review completed.  

## 2012-05-30 ENCOUNTER — Inpatient Hospital Stay (HOSPITAL_COMMUNITY): Payer: Medicaid Other

## 2012-05-30 LAB — VANCOMYCIN, TROUGH: Vancomycin Tr: 22.5 ug/mL — ABNORMAL HIGH (ref 10.0–20.0)

## 2012-05-30 MED ORDER — ADULT MULTIVITAMIN W/MINERALS CH: 1.0000 | ORAL_TABLET | Freq: Every day | ORAL | Status: DC

## 2012-05-30 MED ORDER — IBUPROFEN 600 MG PO TABS
600.0000 mg | ORAL_TABLET | Freq: Four times a day (QID) | ORAL | Status: DC | PRN
  Administered 2012-05-30: 600 mg via ORAL
  Filled 2012-05-30 (×2): qty 1

## 2012-05-30 MED ORDER — TRAMADOL HCL 50 MG PO TABS: 50.0000 mg | ORAL_TABLET | Freq: Four times a day (QID) | ORAL | Status: DC | PRN

## 2012-05-30 MED ORDER — SODIUM CHLORIDE 0.9 % IV SOLN: 250.0000 mg | Freq: Three times a day (TID) | INTRAVENOUS | Status: AC

## 2012-05-30 MED ORDER — METOCLOPRAMIDE HCL 5 MG/ML IJ SOLN
10.0000 mg | Freq: Four times a day (QID) | INTRAMUSCULAR | Status: DC | PRN
  Administered 2012-05-30: 10 mg via INTRAVENOUS
  Filled 2012-05-30: qty 2

## 2012-05-30 MED ORDER — ALPRAZOLAM 0.5 MG PO TABS
1.5000 mg | ORAL_TABLET | Freq: Three times a day (TID) | ORAL | Status: DC | PRN
  Administered 2012-05-30 – 2012-06-01 (×6): 1.5 mg via ORAL
  Filled 2012-05-30 (×7): qty 3

## 2012-05-30 MED ORDER — VANCOMYCIN HCL 10 G IV SOLR: 1250.0000 mg | Freq: Two times a day (BID) | INTRAVENOUS | Status: AC

## 2012-05-30 MED ORDER — GUAIFENESIN ER 600 MG PO TB12: 600.0000 mg | ORAL_TABLET | Freq: Two times a day (BID) | ORAL | Status: DC

## 2012-05-30 MED ORDER — VANCOMYCIN HCL IN DEXTROSE 1-5 GM/200ML-% IV SOLN
1000.0000 mg | Freq: Two times a day (BID) | INTRAVENOUS | Status: DC
  Administered 2012-05-30 – 2012-05-31 (×2): 1000 mg via INTRAVENOUS
  Filled 2012-05-30 (×3): qty 200

## 2012-05-30 MED ORDER — POLYSACCHARIDE IRON COMPLEX 150 MG PO CAPS: 150.0000 mg | ORAL_CAPSULE | Freq: Every day | ORAL | Status: DC

## 2012-05-30 MED ORDER — IPRATROPIUM BROMIDE 0.02 % IN SOLN: 0.5000 mg | Freq: Four times a day (QID) | RESPIRATORY_TRACT | Status: DC

## 2012-05-30 MED ORDER — BUDESONIDE 0.5 MG/2ML IN SUSP: 0.5000 mg | Freq: Two times a day (BID) | RESPIRATORY_TRACT | Status: DC

## 2012-05-30 MED ORDER — IBUPROFEN 600 MG PO TABS: 600.0000 mg | ORAL_TABLET | Freq: Four times a day (QID) | ORAL | Status: DC | PRN

## 2012-05-30 MED ORDER — FOLIC ACID 1 MG PO TABS: 1.0000 mg | ORAL_TABLET | Freq: Every day | ORAL | Status: DC

## 2012-05-30 MED ORDER — NICOTINE 14 MG/24HR TD PT24: 1.0000 | MEDICATED_PATCH | Freq: Every day | TRANSDERMAL | Status: DC

## 2012-05-30 MED ORDER — ALBUTEROL SULFATE (5 MG/ML) 0.5% IN NEBU: 2.5000 mg | INHALATION_SOLUTION | Freq: Four times a day (QID) | RESPIRATORY_TRACT | Status: DC

## 2012-05-30 MED ORDER — ENSURE COMPLETE PO LIQD: 237.0000 mL | Freq: Two times a day (BID) | ORAL | Status: DC

## 2012-05-30 NOTE — Progress Notes (Signed)
Clinical Child psychotherapist (CSW) visited pt room to provide bed offers. Pt stated she would only want to stay at a facility for 10days. CSW informed pt that she would have to stay at a facility for 30 days in order for her insurance to pay. Pt was very adamant that she would not stay at a facility that long and preferred to go home with [facility] services. CSW inquired who would provide support for pt at home and pt stated her brother could provide care. CSW reminded pt of risks associated with pt going home rather than rehab. Pt remains concerned about the facility taking her monthly check and is therefore not agreeable. CSW informed RNCM of pt wishes to return home with [facility]. CSW to remain following in the instance that pt changes her mind or is unable to return home.  Theresia Bough, MSW, [facility] [phone]

## 2012-05-30 NOTE — Progress Notes (Signed)
ANTIBIOTIC CONSULT NOTE - FOLLOW UP  Pharmacy Consult for Vancomycin Indication: Lung abscess  Allergies  Allergen Reactions  . Codeine Itching and Other (See Comments)    Severe anxiety  . Other Swelling and Other (See Comments)    Steroids  "feels like choking"  . Spiriva (Tiotropium Bromide Monohydrate) Other (See Comments)    Patient does not like and states it doesn't work for her    Patient Measurements: Height: 5\' 5"  (165.1 cm) Weight: 93 lb 7.6 oz (42.4 kg) IBW/kg (Calculated) : 57   Vital Signs: Temp: 97.6 F (36.4 C) (01/15 1524) Temp src: Oral (01/15 1524) BP: 91/60 mmHg (01/15 1524) Pulse Rate: 90  (01/15 1524) Intake/Output from previous day: 01/14 0701 - 01/15 0700 In: 1530 [P.O.:720; I.V.:460; IV Piggyback:350] Out: 2075 [Urine:2075] Intake/Output from this shift: Total I/O In: 180 [I.V.:180] Out: 500 [Urine:500]  Labs:  Basename 05/29/12 0440 05/28/12 0400  WBC 9.7 8.7  HGB 7.8* 8.7*  PLT 247 256  LABCREA -- --  CREATININE 0.56 0.74   Estimated Creatinine Clearance: 53.8 ml/min (by C-G formula based on Cr of 0.56).  Basename 05/30/12 1530  VANCOTROUGH 22.5*  VANCOPEAK --  Drue Dun --  GENTTROUGH --  GENTPEAK --  GENTRANDOM --  TOBRATROUGH --  TOBRAPEAK --  TOBRARND --  AMIKACINPEAK --  AMIKACINTROU --  AMIKACIN --     Microbiology: Recent Results (from the past 720 hour(s))  CULTURE, RESPIRATORY     Status: Normal   Collection Time   05/01/12  2:12 PM      Component Value Range Status Comment   Specimen Description BRONCHIAL WASHINGS   Final    Special Requests NONE   Final    Gram Stain     Final    Value: ABUNDANT WBC PRESENT, PREDOMINANTLY PMN     NO SQUAMOUS EPITHELIAL CELLS SEEN     NO ORGANISMS SEEN   Culture Non-Pathogenic Oropharyngeal-type Flora Isolated.   Final    Report Status 05/04/2012 FINAL   Final   FUNGUS CULTURE W SMEAR     Status: Normal   Collection Time   05/01/12  2:12 PM      Component Value  Range Status Comment   Specimen Description BRONCHIAL WASHINGS   Final    Special Requests NONE   Final    Fungal Smear NO YEAST OR FUNGAL ELEMENTS SEEN   Final    Culture CANDIDA ALBICANS   Final    Report Status 05/28/2012 FINAL   Final   AFB CULTURE WITH SMEAR     Status: Normal (Preliminary result)   Collection Time   05/01/12  2:12 PM      Component Value Range Status Comment   Specimen Description BRONCHIAL WASHINGS   Final    Special Requests NONE   Final    ACID FAST SMEAR NO ACID FAST BACILLI SEEN   Final    Culture     Final    Value: CULTURE WILL BE EXAMINED FOR 6 WEEKS BEFORE ISSUING A FINAL REPORT   Report Status PENDING   Incomplete   SURGICAL PCR SCREEN     Status: Normal   Collection Time   05/22/12 11:14 PM      Component Value Range Status Comment   MRSA, PCR NEGATIVE  NEGATIVE Final    Staphylococcus aureus NEGATIVE  NEGATIVE Final   FUNGUS CULTURE W SMEAR     Status: Normal (Preliminary result)   Collection Time   05/23/12  8:58 AM  Component Value Range Status Comment   Specimen Description BRONCHIAL WASHINGS   Final    Special Requests NONE   Final    Fungal Smear NO YEAST OR FUNGAL ELEMENTS SEEN   Final    Culture CULTURE IN PROGRESS FOR FOUR WEEKS   Final    Report Status PENDING   Incomplete   AFB CULTURE WITH SMEAR     Status: Normal (Preliminary result)   Collection Time   05/23/12  8:58 AM      Component Value Range Status Comment   Specimen Description BRONCHIAL WASHINGS   Final    Special Requests NONE   Final    ACID FAST SMEAR NO ACID FAST BACILLI SEEN   Final    Culture     Final    Value: CULTURE WILL BE EXAMINED FOR 6 WEEKS BEFORE ISSUING A FINAL REPORT   Report Status PENDING   Incomplete   GRAM STAIN     Status: Normal   Collection Time   05/23/12  8:58 AM      Component Value Range Status Comment   Specimen Description BRONCHIAL WASHINGS   Final    Special Requests NONE   Final    Gram Stain     Final    Value: ABUNDANT WBC PRESENT,  PREDOMINANTLY PMN     FEW GRAM POSITIVE COCCI IN PAIRS     CALLED TO Andrey Campanile D RN 05/23/12 0935 COSTELLO B   Report Status 05/23/2012 FINAL   Final   CULTURE, RESPIRATORY     Status: Normal   Collection Time   05/23/12  8:58 AM      Component Value Range Status Comment   Specimen Description BRONCHIAL WASHINGS   Final    Special Requests NONE RIGHT LUNG   Final    Gram Stain     Final    Value: ABUNDANT WBC PRESENT, PREDOMINANTLY PMN     FEW GRAM POSITIVE COCCI IN PAIRS     Gram Stain Report Called to,Read Back By and Verified With: Gram Stain Report Called to,Read Back By and Verified With:  WILSON D RN @09 :36 ON 05/23/12 BY COSTELLO B   Culture Non-Pathogenic Oropharyngeal-type Flora Isolated.   Final    Report Status 05/25/2012 FINAL   Final   WOUND CULTURE     Status: Normal   Collection Time   05/23/12 10:39 AM      Component Value Range Status Comment   Specimen Description WOUND RIGHT CHEST   Final    Special Requests SWAB OF THE UPPER CHEST CAVITY    Final    Gram Stain     Final    Value: ABUNDANT WBC PRESENT,BOTH PMN AND MONONUCLEAR     RARE SQUAMOUS EPITHELIAL CELLS PRESENT     RARE GRAM POSITIVE COCCI IN PAIRS   Culture NO GROWTH 2 DAYS   Final    Report Status 05/25/2012 FINAL   Final   ANAEROBIC CULTURE     Status: Normal   Collection Time   05/23/12 10:39 AM      Component Value Range Status Comment   Specimen Description WOUND RIGHT CHEST   Final    Special Requests SWAB OF THE UPPER CHEST CAVITY   Final    Gram Stain     Final    Value: ABUNDANT WBC PRESENT,BOTH PMN AND MONONUCLEAR     RARE SQUAMOUS EPITHELIAL CELLS PRESENT     RARE GRAM POSITIVE COCCI IN PAIRS   Culture NO  ANAEROBES ISOLATED   Final    Report Status 05/28/2012 FINAL   Final     Anti-infectives     Start     Dose/Rate Route Frequency Ordered Stop   05/31/12 0000   sodium chloride 0.9 % SOLN 100 mL with imipenem-cilastatin 250 MG SOLR 250 mg        250 mg 200 mL/hr over 30 Minutes Intravenous Every  8 hours 05/30/12 1315 06/18/12 2359   05/31/12 0000   sodium chloride 0.9 % SOLN 250 mL with vancomycin 10 G SOLR 1,250 mg        1,250 mg 166.7 mL/hr over 90 Minutes Intravenous Every 12 hours 05/30/12 1315 06/18/12 2359   05/27/12 1600   vancomycin (VANCOCIN) 1,250 mg in sodium chloride 0.9 % 250 mL IVPB        1,250 mg 166.7 mL/hr over 90 Minutes Intravenous Every 12 hours 05/27/12 1518     05/26/12 0300   vancomycin (VANCOCIN) IVPB 1000 mg/200 mL premix  Status:  Discontinued        1,000 mg 200 mL/hr over 60 Minutes Intravenous Every 12 hours 05/25/12 1510 05/27/12 1518   05/25/12 1630   vancomycin (VANCOCIN) IVPB 1000 mg/200 mL premix        1,000 mg 200 mL/hr over 60 Minutes Intravenous Every 12 hours 05/25/12 1621 05/25/12 1721   05/22/12 1500   vancomycin (VANCOCIN) IVPB 1000 mg/200 mL premix  Status:  Discontinued        1,000 mg 200 mL/hr over 60 Minutes Intravenous Every 24 hours 05/22/12 1413 05/25/12 1510   05/21/12 1400   imipenem-cilastatin (PRIMAXIN) 500 mg in sodium chloride 0.9 % 100 mL IVPB  Status:  Discontinued        500 mg 200 mL/hr over 30 Minutes Intravenous 3 times per day 05/21/12 1136 05/21/12 1141   05/21/12 1400   imipenem-cilastatin (PRIMAXIN) 250 mg in sodium chloride 0.9 % 100 mL IVPB        250 mg 200 mL/hr over 30 Minutes Intravenous 3 times per day 05/21/12 1141     05/21/12 1300   micafungin (MYCAMINE) 100 mg in sodium chloride 0.9 % 100 mL IVPB  Status:  Discontinued        100 mg 100 mL/hr over 1 Hours Intravenous Daily 05/21/12 1136 05/25/12 1041          Assessment: 57 y.o. F who continues on Vancomycin per Rx + Primaxin per MD for lung abscess s/p VATS, and debridement/drainage this admit. Per ID to continue abx for 28 days. Vancomycin trough this afternoon is slightly SUPRAtherapeutic (Vanc trough 22.5 mcg/ml, goal of 15-20 mcg/ml). The patient is noted to have a bump in SCr on 1/13 to 0.74 << 0.35, which trended back down to 0.56 on  1/14. This likely led to some accumulation of Vancomycin. Will go ahead and reduce dose slightly today. Noted no SCr level today, no BMET entered for tomorrow. Will plan to monitor renal function with a BMET every 3 days to monitor renal fxn for any necessary dose adjustments.   Goal of Therapy:  Vancomycin trough level 15-20 mcg/ml  Plan:  1. Reduce Vancomycin to 1g IV every 12 hours (next dose due at 2000) 2. Will obtain BMET every 72 hours (to evaluate for changes in renal fxn) 3. Will continue to follow renal function, culture results, LOT, and antibiotic de-escalation plans   Georgina Pillion, PharmD, BCPS Clinical Pharmacist Pager: [phone] 05/30/2012 4:50 PM

## 2012-05-30 NOTE — Progress Notes (Addendum)
7 Days Post-Op Procedure(s) (LRB): VIDEO BRONCHOSCOPY (N/A) VIDEO ASSISTED THORACOSCOPY (Right) ELOESSOR PROCEDURE (Right) THORACOTOMY MAJOR (Right) Subjective:  [name] [name] complains of pain, nausea, vomiting and anxiety.  She states that her anxiety medication is still not being administered the way she takes it at home.  She complains of right sided pain.  She is nauseated and vomiting which she thinks is due to the increase in her oral intake  Objective: Vital signs in last 24 hours: Temp:  [97.3 F (36.3 C)-98.1 F (36.7 C)] 97.5 F (36.4 C) (01/15 0811) Pulse Rate:  [71-87] 71  (01/15 0811) Cardiac Rhythm:  [-] Normal sinus rhythm (01/15 0811) Resp:  [9-18] 9  (01/15 0811) BP: (98-109)/(55-74) 105/61 mmHg (01/15 0811) SpO2:  [92 %-100 %] 94 % (01/15 0811)  Intake/Output from previous day: 01/14 0701 - 01/15 0700 In: 1530 [P.O.:720; I.V.:460; IV Piggyback:350] Out: 1925 [Urine:1925] Intake/Output this shift: Total I/O In: 20 [I.V.:20] Out: -   General appearance: alert, cooperative and no distress Heart: regular rate and rhythm Lungs: coarse breath sounds R>L Abdomen: soft, non-tender; bowel sounds normal; no masses,  no organomegaly Wound: chest wounds are clean and dry, back wound is packed, dressing dry  Lab Results:  Basename 05/29/12 0440 05/28/12 0400  WBC 9.7 8.7  HGB 7.8* 8.7*  HCT 24.4* 26.8*  PLT 247 256   BMET:  Basename 05/29/12 0440 05/28/12 0400  NA 140 138  K 4.2 4.5  CL 102 99  CO2 32 34*  GLUCOSE 76 99  BUN 14 14  CREATININE 0.56 0.74  CALCIUM 8.1* 9.4    PT/INR: No results found for this basename: LABPROT,INR in the last 72 hours ABG    Component Value Date/Time   PHART 7.316* 05/24/2012 0420   HCO3 32.0* 05/24/2012 0420   TCO2 34.0 05/24/2012 0420   O2SAT 95.5 05/24/2012 0420   CBG (last 3)  No results found for this basename: GLUCAP:3 in the last 72 hours  Assessment/Plan: S/P Procedure(s) (LRB): VIDEO BRONCHOSCOPY (N/A) VIDEO  ASSISTED THORACOSCOPY (Right) ELOESSOR PROCEDURE (Right) THORACOTOMY MAJOR (Right)  1. CV-NSR 2. Pulm- encouarge IS, wean oxygen if possible, CXR shows stable appearance of sub-Q air, loculated hydropneumohtoraces 3. Anxiety- will change Xanax to TID prn 4. Nausea/Vomiting- add Reglan Q6 prn 5. Pain control- currently taking Ultram, will start Ibuprofen 6. Malnutrition- patient is gaining weight during hospitalization, continue current diet, ensure supplements  LOS: 9 days    [name], [name] 05/30/2012  To snf soon will need long term iv antibiotic and wound care I have seen and examined [name] [name] and agree with the above assessment  and plan.  Delight Ovens MD Beeper [phone] Office [phone] 05/30/2012 3:14 PM

## 2012-05-30 NOTE — Progress Notes (Signed)
Pt was sitting up in bed when I arrived. I introduced myself and she immediately asked me to pray for her. Prior to praying I learned more about her and the reason why she was in the hospital. She appreciated the visit and prayer. Marjory Lies Chaplain

## 2012-05-30 NOTE — Discharge Summary (Signed)
[address]            [place] [address]          [phone]      [name] [name] Feb 24, 1956 57 y.o. [id number]  05/21/2012   Delight Ovens, MD  empyema chronic lung infection  History of Present Illness:  [name] [name] is a 57 yo white female well known to [facility]. She has a history of Lung Cancer S/P Right Upper Lobectomy performed approximately 12 years ago. For the past 6-7 months the patient has been treated at [facility] and [facility] for a right chest/lung process presumed to be a chronic lung infection. She has been treated with IV Antibiotics. However, over time the patient has continued to become progressively cachectic and continues to smoke. She was referred to [facility] for further option on treatment. She was initially evaluated by Dr. Tyrone Sage on 04/19/2012 at which time it was felt the patient would benefit from pneumonectomy. She underwent preoperative pulmonary workup and was seen in follow up on 04/26/2012 at which time it was felt she should underwent Bronchoscopy initially to develop a treatment plan. She presented to [facility] on 05/01/2012 and underwent Bronchoscopy with Biopsy of the right upper lobe bronchial stump. During the procedure patient had tenuous respiratory status requiring progression to general anesthesia. She was subsequently admitted for further workup, however patient signed out AMA prior to this being completed. She presented for follow up on 05/17/2012 at which time the cultures and pathology were reviewed with the patient by Dr. Tyrone Sage. These results did not show evidence of bacterial or recurrent malignancy. The patient was again educated about the need for Pneumonectomy as treatment plan for her chronic lung infection. The risks and benefits of the procedure were explained to the patient and she was agreeable to proceed. The patient presents today for direct admission. She is scheduled to undergo PICC Line  placement and repeat CT scan prior to proceeding with surgery. Currently the patient has no complaints. She does have multiple concerns about proceeding with surgery. She states she does not want to have to use oxygen long term, stating she hates to carry the tank around. She is also very concerned about her Xanax regimen while in the hospital and wants to make sure that we adhere to her outpatient regimen due to her extreme anxiety. She continues to smoke, but states she is down to a half to full pack per day and wishes to quit. She denies any fevers, chills, or sweats. She is admitted to the hospital for further evaluation and treatment.   Past Medical History   Diagnosis  Date   .  Depression with anxiety    .  COPD (chronic obstructive pulmonary disease)    .  Empyema    .  GERD (gastroesophageal reflux disease)    .  Lung cancer      RULobectomy 2001, stage I adenocarcinoma T2, N0, Mx   .  H/O pulmonary fibrosis    .  Tobacco use disorder    .  Vocal cord paralysis    .  Abnormal weight loss    .  Pneumonia    .  Pleural effusion    .  Thyroid nodule      tiny on right   .  Hydropneumothorax    .  Normocytic anemia    .  Hemoptysis    .  Shortness of breath      with exertion    Past Surgical History   Procedure  Date   .  Lung lobectomy  2001     right upper lobe   .  Thoracotomy      right side   .  Right forearm surgery after trauma    .  Peripherally inserted central catheter insertion    .  Eye surgery      corrected "Cross eyes"   .  Video bronchoscopy  05/01/2012     Procedure: VIDEO BRONCHOSCOPY; Surgeon: Delight Ovens, MD; Location: [facility] OR; Service: Thoracic; Laterality: N/A; Video Bronchoscopy    No family history on file.  History    Social History   .  Marital Status:  Divorced     Spouse Name:  N/A     Number of Children:  N/A   .  Years of Education:  N/A    Occupational History   .  unemployed     Social History Main Topics   .  Smoking  status:  Current Every Day Smoker -- 1.0 packs/day for 38 years   .  Smokeless tobacco:  Not on file   .  Alcohol Use:  No   .  Drug Use:  No   .  Sexually Active:  Not on file    Other Topics  Concern   .  Not on file    Social History Narrative    She lives alone, she is divorced,    History   Smoking status   .  Current Every Day Smoker -- 1.0 packs/day for 38 years   Smokeless tobacco   .  Not on file    History   Alcohol Use  No    Allergies   Allergen  Reactions   .  Codeine  Other (See Comments)     Severe anxiety   .  Other  Other (See Comments)     Steroids  "feels like choking"    Current Outpatient Prescriptions   Medication  Sig  Dispense  Refill   .  albuterol (PROVENTIL HFA;VENTOLIN HFA) 108 (90 BASE) MCG/ACT inhaler  Inhale 2 puffs into the lungs 2 (two) times daily as needed. For shortness of breath     .  ALPRAZolam (XANAX) 1 MG tablet  Take 1 mg by mouth 3 (three) times daily as needed. Takes 11/2 tabs three times daily     .  dicyclomine (BENTYL) 20 MG tablet  Take 20 mg by mouth 3 (three) times daily. 1 1/2 Tabs t.i.d     .  omeprazole (PRILOSEC) 40 MG capsule  Take 40 mg by mouth 2 (two) times daily.        Hospital Course:   The patient was admitted to the hospital with chronic pulmonary infection. She was felt to require surgical intervention to include possible completion pneumonectomy. Additionally she was felt to require pulmonology and infectious disease consultation which were obtained on admission. Additionally a PICC line was placed. Additional radiographic studies including CT scan of the chest was obtained. Ultimately was determined that she should undergo surgical intervention and on 05/23/2012 she was taken to the operating room where she underwent the following procedure:  OPERATIVE REPORT  PREOPERATIVE DIAGNOSIS: Right chronic empyema, status post right upper  lobectomy.  POSTOPERATIVE DIAGNOSIS: Right chronic empyema, status post right  upper  lobectomy.  SURGICAL PROCEDURE: Bronchoscopy, right video-assisted thoracoscopy,  right thoracotomy, drainage of empyema, and creation of Elosser flap.  SURGEON: Sheliah Plane, MD.  FIRST ASSISTANT: Doree Fudge, [place]. Because of the  degree of secretions that had been present, it was decided not to  extubate the patient and operate, and she was transferred directly to  the Surgical Intensive Care Unit for further postoperative care. Blood  loss was minimal. Sponge and needle count was reported as correct at  completion of procedure.   Postoperative hospital course:  She's continued to be followed closely by pulmonary and infectious disease monitored and adjusted her medications appropriately. She was weaned from the ventilator without significant difficulty. All routine lines, monitors, and tubes have been discontinued in standard fashion using usual protocols and observation. She has maintained stable hemodynamics. She has begun irrigation of the empyema cavity. Cultures have been negative and unrevealing. Initially including her antibiotics was antifungal but this was discontinued and she will be scheduled to receive 28 days of total vancomycin and imipenem intravenous antibiotic therapy.  Completion date of IV ABX will be June 19, 2012.  The patient will require daily wound care.  Her wound needs irrigated with 120 ml of NS, inserted through a red rubber foley catheter.  The wound will need dressed wet to dry with abdominal pads and covered with Montgomery straps.  This needs to be performed as sterile procedure.  The patient is medically stable at this time.  SNF placement has been arranged and we will discharge the patient today.  She will need to follow up with Dr. Bard Herbert of Pulmonary Medicine, Dr. Ninetta Lights with Infectious Disease, and Dr. Tyrone Sage.  The appointment dates and times are listed below  [facility] 06/01/12 0450 05/31/12 0930  NA 135 135  K 4.4 4.5  CL 92* 94*    CO2 39* 38*  GLUCOSE 93 103*  BUN 7 9  CALCIUM 9.5 9.4    Basename 06/01/12 0450 05/31/12 0930  WBC 13.5* 17.5*  HGB 8.6* 8.5*  HCT 27.2* 26.1*  PLT 315 292   No results found for this basename: INR:2 in the last 72 hours   Discharge Instructions:  The patient is discharged to home with extensive instructions on wound care and progressive ambulation.  They are instructed not to drive or perform any heavy lifting until returning to see the physician in his office.  Discharge Diagnosis:  empyema chronic lung infection  Secondary Diagnosis: Patient Active Problem List  Diagnosis  . Depression with anxiety  . GERD (gastroesophageal reflux disease)  . Lung cancer  . H/O pulmonary fibrosis  . Tobacco use disorder  . Vocal cord paralysis  . Abnormal weight loss  . Enlarged lymph nodes  . Abscess of lung  . Emphysema  . COPD (chronic obstructive pulmonary disease)  . Respiratory failure, post-operative   Past Medical History  Diagnosis Date  . Depression with anxiety   . COPD (chronic obstructive pulmonary disease)   . Empyema   . GERD (gastroesophageal reflux disease)   . Lung cancer     RULobectomy 2001, stage I adenocarcinoma T2, N0, Mx    . H/O pulmonary fibrosis   . Tobacco use disorder   . Vocal cord paralysis   . Abnormal weight loss   . Pneumonia   . Pleural effusion   . Thyroid nodule     tiny on right   . Hydropneumothorax   . Normocytic anemia   . Hemoptysis   . Shortness of breath     with exertion  [name] [name], [name] [name]  Home Medication Instructions [id number]   Printed on:05/30/12 1332  Medication Information                    ALPRAZolam (XANAX) 1 MG tablet Take 1.5 mg by mouth 3 (three) times daily.            albuterol (PROVENTIL HFA;VENTOLIN HFA) 108 (90 BASE) MCG/ACT inhaler Inhale 2 puffs into the lungs every 4 (four) hours as needed. For shortness of breath           omeprazole (PRILOSEC) 40 MG capsule Take 40 mg by mouth 2  (two) times daily.            budesonide (PULMICORT) 0.5 MG/2ML nebulizer solution Take 2 mLs (0.5 mg total) by nebulization 2 (two) times daily.           feeding supplement (ENSURE COMPLETE) LIQD Take 237 mLs by mouth 2 (two) times daily between meals.           folic acid (FOLVITE) 1 MG tablet Take 1 tablet (1 mg total) by mouth daily.           guaiFENesin (MUCINEX) 600 MG 12 hr tablet Take 1 tablet (600 mg total) by mouth 2 (two) times daily.           ibuprofen (ADVIL,MOTRIN) 600 MG tablet Take 1 tablet (600 mg total) by mouth every 6 (six) hours as needed.           ipratropium (ATROVENT) 0.02 % nebulizer solution Take 2.5 mLs (0.5 mg total) by nebulization every 6 (six) hours.           albuterol (PROVENTIL) (5 MG/ML) 0.5% nebulizer solution Take 0.5 mLs (2.5 mg total) by nebulization every 6 (six) hours.           iron polysaccharides (NIFEREX) 150 MG capsule Take 1 capsule (150 mg total) by mouth daily.           Multiple Vitamin (MULTIVITAMIN WITH MINERALS) TABS Take 1 tablet by mouth daily.           nicotine (NICODERM CQ - DOSED IN MG/24 HOURS) 14 mg/24hr patch Place 1 patch onto the skin daily.           traMADol (ULTRAM) 50 MG tablet Take 1-2 tablets (50-100 mg total) by mouth every 6 (six) hours as needed.           sodium chloride 0.9 % SOLN 100 mL with imipenem-cilastatin 250 MG SOLR 250 mg Inject 250 mg into the vein every 8 (eight) hours.           sodium chloride 0.9 % SOLN 250 mL with vancomycin 10 G SOLR 1,250 mg Inject 1,250 mg into the vein every 12 (twelve) hours.            Follow-up Information    Follow up with GERHARDT,EDWARD B, MD. (See Dr Tyrone Sage 06/14/12 at 1:30 pm. Please obtain a chest xray at 12:30 at [facility] imaging. [facility] is located in the same office complex.)    Contact information:   [address] [place] [address] [phone]       Follow up with Billy Fischer, MD. On 06/15/2012. (Appointment  is at 11:15, please arrive at 11:00)    Contact information:   [address] [place] [address] [phone]       Follow up with [name], MD. Schedule an appointment as soon as possible  for a visit on 06/21/2012. (Appointment is at 3:00 pm)    Contact information:   [address]  [address] [place] [address] [phone]          Disposition: SNF  Patient's condition is Juliette Alcide, [place]  06/01/2012  10:35 AM

## 2012-05-31 ENCOUNTER — Inpatient Hospital Stay (HOSPITAL_COMMUNITY): Payer: Medicaid Other

## 2012-05-31 LAB — BASIC METABOLIC PANEL
BUN: 9 mg/dL (ref 6–23)
Chloride: 94 mEq/L — ABNORMAL LOW (ref 96–112)
GFR calc non Af Amer: >90 mL/min (ref >90)
Glucose, Bld: 103 mg/dL — ABNORMAL HIGH (ref 70–99)
Potassium: 4.5 mEq/L (ref 3.5–5.1)

## 2012-05-31 LAB — CBC
HCT: 26.1 % — ABNORMAL LOW (ref 36.0–46.0)
Hemoglobin: 8.5 g/dL — ABNORMAL LOW (ref 12.0–15.0)
MCHC: 32.6 g/dL (ref 30.0–36.0)

## 2012-05-31 MED ORDER — VANCOMYCIN HCL IN DEXTROSE 1-5 GM/200ML-% IV SOLN
1000.0000 mg | Freq: Two times a day (BID) | INTRAVENOUS | Status: DC
  Administered 2012-06-01: 1000 mg via INTRAVENOUS
  Filled 2012-05-31 (×2): qty 200

## 2012-05-31 MED ORDER — FUROSEMIDE 10 MG/ML IJ SOLN
INTRAMUSCULAR | Status: AC
  Filled 2012-05-31: qty 4

## 2012-05-31 MED ORDER — FUROSEMIDE 10 MG/ML IJ SOLN
20.0000 mg | Freq: Every day | INTRAMUSCULAR | Status: DC
  Administered 2012-05-31 – 2012-06-01 (×2): 20 mg via INTRAVENOUS
  Filled 2012-05-31: qty 2

## 2012-05-31 NOTE — Progress Notes (Signed)
8 Days Post-Op Procedure(s) (LRB): VIDEO BRONCHOSCOPY (N/A) VIDEO ASSISTED THORACOSCOPY (Right) ELOESSOR PROCEDURE (Right) THORACOTOMY MAJOR (Right) Subjective:  [name] [name] has no new complaints this morning.  She continues to express wishes to go home instead of inpatient facility, despite repeat conversations regarding importance of good care for her wound at discharge.   Objective: Vital signs in last 24 hours: Temp:  [97.2 F (36.2 C)-98.1 F (36.7 C)] 98.1 F (36.7 C) (01/16 0800) Pulse Rate:  [58-90] 58  (01/16 0800) Cardiac Rhythm:  [-] Sinus bradycardia (01/16 0800) Resp:  [11-24] 12  (01/16 0800) BP: (91-123)/(48-69) 107/62 mmHg (01/16 0800) SpO2:  [91 %-100 %] 100 % (01/16 0800)    Intake/Output from previous day: 01/15 0701 - 01/16 0700 In: 460 [I.V.:460] Out: 1000 [Urine:1000] Intake/Output this shift: Total I/O In: 220 [I.V.:20; IV Piggyback:200] Out: 200 [Urine:200]  General appearance: alert, cooperative and no distress Heart: regular rate and rhythm Lungs: coarse breath sounds R>L Abdomen: soft, non-tender; bowel sounds normal; no masses,  no organomegaly Wound: clean, packed   Lab Results:  Basename 05/29/12 0440  WBC 9.7  HGB 7.8*  HCT 24.4*  PLT 247   BMET:  Basename 05/29/12 0440  NA 140  K 4.2  CL 102  CO2 32  GLUCOSE 76  BUN 14  CREATININE 0.56  CALCIUM 8.1*    PT/INR: No results found for this basename: LABPROT,INR in the last 72 hours ABG    Component Value Date/Time   PHART 7.316* 05/24/2012 0420   HCO3 32.0* 05/24/2012 0420   TCO2 34.0 05/24/2012 0420   O2SAT 95.5 05/24/2012 0420   CBG (last 3)  No results found for this basename: GLUCAP:3 in the last 72 hours  Assessment/Plan: S/P Procedure(s) (LRB): VIDEO BRONCHOSCOPY (N/A) VIDEO ASSISTED THORACOSCOPY (Right) ELOESSOR PROCEDURE (Right) THORACOTOMY MAJOR (Right)  1. CV- NSR 2. Pulm- worsening infiltrates on CXR suspicious for pneumonia- currently on Vanc/Imipenem 3. ID-  currently on IV Vanc/Imipenem will need 28 Days total (10/28) 4. Expected post operative anemia- last hgb 7.8 will repeat  5. Malnutrition- continue dietary recommendations, supplementations 6. Dispo- patient needs IV ABX and good wound care at discharge, needs inpatient placement however patient is declining, will continue to encourage placement   LOS: 10 days    [name] [name] 05/31/2012

## 2012-05-31 NOTE — Progress Notes (Signed)
Clinical Child psychotherapist (CSW) met with pt again today to discuss discharge plans and need for SNF placement. CSW continued to provide clarity and answer pt concerns regarding placement. CSW received consent to speak to pt brother who encouraged pt to go to rehab at discharge. Pt stated she would accept bed offer at [facility]. CSW observed that pt was smiling and looking forward to discharging to the facility. CSW contacted Raquel with the facility who confirmed a bed remains available for pt. CSW notified RNCM and unit RN. CSW to facilitate with discharge when pt medically ready.  Theresia Bough, MSW, Theresia Majors [phone]

## 2012-05-31 NOTE — Progress Notes (Signed)
Pt. Refused CPAP at this time. Encouraged to call RT if needed.

## 2012-05-31 NOTE — Progress Notes (Signed)
Tried to get patient to ambulate numerous times throughout the day but patient refuses.  Explained the importance of increased activity for healing, but still refused all day.  Will continue to monitor.

## 2012-06-01 ENCOUNTER — Inpatient Hospital Stay (HOSPITAL_COMMUNITY): Payer: Medicaid Other

## 2012-06-01 LAB — BASIC METABOLIC PANEL
BUN: 7 mg/dL (ref 6–23)
CO2: 39 mEq/L — ABNORMAL HIGH (ref 19–32)
Calcium: 9.5 mg/dL (ref 8.4–10.5)
Chloride: 92 mEq/L — ABNORMAL LOW (ref 96–112)
Creatinine, Ser: 0.47 mg/dL — ABNORMAL LOW (ref 0.50–1.10)
GFR calc Af Amer: >90 mL/min (ref >90)
GFR calc non Af Amer: >90 mL/min (ref >90)
Glucose, Bld: 93 mg/dL (ref 70–99)
Potassium: 4.4 mEq/L (ref 3.5–5.1)
Sodium: 135 mEq/L (ref 135–145)

## 2012-06-01 LAB — CBC
HCT: 27.2 % — ABNORMAL LOW (ref 36.0–46.0)
MCH: 28.8 pg (ref 26.0–34.0)
MCHC: 31.6 g/dL (ref 30.0–36.0)
MCV: 91 fL (ref 78.0–100.0)
RDW: 15.4 % (ref 11.5–15.5)

## 2012-06-01 MED ORDER — VANCOMYCIN HCL IN DEXTROSE 1-5 GM/200ML-% IV SOLN: INTRAVENOUS | Status: DC

## 2012-06-01 MED ORDER — IMIPENEM-CILASTATIN 250 MG IV SOLR: INTRAVENOUS | Status: DC

## 2012-06-01 NOTE — Progress Notes (Signed)
9 Days Post-Op Procedure(s) (LRB): VIDEO BRONCHOSCOPY (N/A) VIDEO ASSISTED THORACOSCOPY (Right) ELOESSOR PROCEDURE (Right) THORACOTOMY MAJOR (Right) Subjective:  [name] [name] complains of being "stir crazy" in the hospital.  Wants to get out of here as soon as possible.   Objective: Vital signs in last 24 hours: Temp:  [97.4 F (36.3 C)-98.4 F (36.9 C)] 97.5 F (36.4 C) (01/17 0719) Pulse Rate:  [64-95] 73  (01/17 0418) Cardiac Rhythm:  [-] Normal sinus rhythm (01/17 0418) Resp:  [10-26] 10  (01/17 0418) BP: (85-124)/(48-65) 90/58 mmHg (01/17 0418) SpO2:  [92 %-99 %] 95 % (01/17 0803)   Intake/Output from previous day: 01/16 0701 - 01/17 0700 In: 1442 [P.O.:940; I.V.:200; IV Piggyback:302] Out: 2200 [Urine:2200]  General appearance: alert, cooperative and no distress Heart: regular rate and rhythm Lungs: coarse R>L Abdomen: soft, non-tender; bowel sounds normal; no masses,  no organomegaly Wound: wound packed, dressing clean and dry  Lab Results:  Basename 06/01/12 0450 05/31/12 0930  WBC 13.5* 17.5*  HGB 8.6* 8.5*  HCT 27.2* 26.1*  PLT 315 292   BMET:  Basename 06/01/12 0450 05/31/12 0930  NA 135 135  K 4.4 4.5  CL 92* 94*  CO2 39* 38*  GLUCOSE 93 103*  BUN 7 9  CREATININE 0.47* 0.45*  CALCIUM 9.5 9.4    PT/INR: No results found for this basename: LABPROT,INR in the last 72 hours ABG    Component Value Date/Time   PHART 7.316* 05/24/2012 0420   HCO3 32.0* 05/24/2012 0420   TCO2 34.0 05/24/2012 0420   O2SAT 95.5 05/24/2012 0420   CBG (last 3)  No results found for this basename: GLUCAP:3 in the last 72 hours  Assessment/Plan: S/P Procedure(s) (LRB): VIDEO BRONCHOSCOPY (N/A) VIDEO ASSISTED THORACOSCOPY (Right) ELOESSOR PROCEDURE (Right) THORACOTOMY MAJOR (Right)   1. CV- NSR 2. Pulm- continued air space disease, right sided hydropneumothorax  3. ID- continue IV ABX for 28 days of complete therapy on IV Vanc and Imipenem Day 11/28 4. Malnutrition- weight  is improving, continue supplementation 5. Acute anemia- stable, hgb stable at 8.6 6. Dispo- patient stable will plan to d/c to nursing home today   LOS: 11 days    Raford Pitcher, Denny Peon 06/01/2012

## 2012-06-01 NOTE — Progress Notes (Signed)
ANTIBIOTIC CONSULT NOTE - FOLLOW UP  Pharmacy Consult for Vancomycin Indication: Lung abscess  Allergies  Allergen Reactions  . Codeine Itching and Other (See Comments)    Severe anxiety  . Other Swelling and Other (See Comments)    Steroids  "feels like choking"  . Spiriva (Tiotropium Bromide Monohydrate) Other (See Comments)    Patient does not like and states it doesn't work for her    Patient Measurements: Height: 5\' 5"  (165.1 cm) Weight: 93 lb 7.6 oz (42.4 kg) IBW/kg (Calculated) : 57   Vital Signs: Temp: 97.5 F (36.4 C) (01/17 0800) Temp src: Oral (01/17 0800) BP: 113/73 mmHg (01/17 0800) Pulse Rate: 83  (01/17 0800) Intake/Output from previous day: 01/16 0701 - 01/17 0700 In: 1462 [P.O.:940; I.V.:220; IV Piggyback:302] Out: 2200 [Urine:2200] Intake/Output from this shift: Total I/O In: 40 [I.V.:40] Out: -   Labs:  Basename 06/01/12 0450 05/31/12 0930  WBC 13.5* 17.5*  HGB 8.6* 8.5*  PLT 315 292  LABCREA -- --  CREATININE 0.47* 0.45*   Estimated Creatinine Clearance: 53.8 ml/min (by C-G formula based on Cr of 0.47).  Basename 05/30/12 1530  VANCOTROUGH 22.5*  VANCOPEAK --  Drue Dun --  GENTTROUGH --  GENTPEAK --  GENTRANDOM --  TOBRATROUGH --  TOBRAPEAK --  TOBRARND --  AMIKACINPEAK --  AMIKACINTROU --  AMIKACIN --     Microbiology: Recent Results (from the past 720 hour(s))  SURGICAL PCR SCREEN     Status: Normal   Collection Time   05/22/12 11:14 PM      Component Value Range Status Comment   MRSA, PCR NEGATIVE  NEGATIVE Final    Staphylococcus aureus NEGATIVE  NEGATIVE Final   FUNGUS CULTURE W SMEAR     Status: Normal (Preliminary result)   Collection Time   05/23/12  8:58 AM      Component Value Range Status Comment   Specimen Description BRONCHIAL WASHINGS   Final    Special Requests NONE   Final    Fungal Smear NO YEAST OR FUNGAL ELEMENTS SEEN   Final    Culture CULTURE IN PROGRESS FOR FOUR WEEKS   Final    Report Status  PENDING   Incomplete   AFB CULTURE WITH SMEAR     Status: Normal (Preliminary result)   Collection Time   05/23/12  8:58 AM      Component Value Range Status Comment   Specimen Description BRONCHIAL WASHINGS   Final    Special Requests NONE   Final    ACID FAST SMEAR NO ACID FAST BACILLI SEEN   Final    Culture     Final    Value: CULTURE WILL BE EXAMINED FOR 6 WEEKS BEFORE ISSUING A FINAL REPORT   Report Status PENDING   Incomplete   GRAM STAIN     Status: Normal   Collection Time   05/23/12  8:58 AM      Component Value Range Status Comment   Specimen Description BRONCHIAL WASHINGS   Final    Special Requests NONE   Final    Gram Stain     Final    Value: ABUNDANT WBC PRESENT, PREDOMINANTLY PMN     FEW GRAM POSITIVE COCCI IN PAIRS     CALLED TO Elby Showers RN 05/23/12 0935 COSTELLO B   Report Status 05/23/2012 FINAL   Final   CULTURE, RESPIRATORY     Status: Normal   Collection Time   05/23/12  8:58 AM  Component Value Range Status Comment   Specimen Description BRONCHIAL WASHINGS   Final    Special Requests NONE RIGHT LUNG   Final    Gram Stain     Final    Value: ABUNDANT WBC PRESENT, PREDOMINANTLY PMN     FEW GRAM POSITIVE COCCI IN PAIRS     Gram Stain Report Called to,Read Back By and Verified With: Gram Stain Report Called to,Read Back By and Verified With:  Elby Showers RN @09 :36 ON 05/23/12 BY COSTELLO B   Culture Non-Pathogenic Oropharyngeal-type Flora Isolated.   Final    Report Status 05/25/2012 FINAL   Final   WOUND CULTURE     Status: Normal   Collection Time   05/23/12 10:39 AM      Component Value Range Status Comment   Specimen Description WOUND RIGHT CHEST   Final    Special Requests SWAB OF THE UPPER CHEST CAVITY    Final    Gram Stain     Final    Value: ABUNDANT WBC PRESENT,BOTH PMN AND MONONUCLEAR     RARE SQUAMOUS EPITHELIAL CELLS PRESENT     RARE GRAM POSITIVE COCCI IN PAIRS   Culture NO GROWTH 2 DAYS   Final    Report Status 05/25/2012 FINAL   Final   ANAEROBIC  CULTURE     Status: Normal   Collection Time   05/23/12 10:39 AM      Component Value Range Status Comment   Specimen Description WOUND RIGHT CHEST   Final    Special Requests SWAB OF THE UPPER CHEST CAVITY   Final    Gram Stain     Final    Value: ABUNDANT WBC PRESENT,BOTH PMN AND MONONUCLEAR     RARE SQUAMOUS EPITHELIAL CELLS PRESENT     RARE GRAM POSITIVE COCCI IN PAIRS   Culture NO ANAEROBES ISOLATED   Final    Report Status 05/28/2012 FINAL   Final     Anti-infectives     Start     Dose/Rate Route Frequency Ordered Stop   06/01/12 1000   vancomycin (VANCOCIN) IVPB 1000 mg/200 mL premix        1,000 mg 200 mL/hr over 60 Minutes Intravenous Every 12 hours 05/31/12 1339     06/01/12 0000   vancomycin (VANCOCIN) 1 GM/200ML SOLN        over 60 Minutes   06/01/12 0925     06/01/12 0000   imipenem-cilastatin (PRIMAXIN) 250 MG injection           06/01/12 0931     05/31/12 0000   sodium chloride 0.9 % SOLN 100 mL with imipenem-cilastatin 250 MG SOLR 250 mg        250 mg 200 mL/hr over 30 Minutes Intravenous Every 8 hours 05/30/12 1315 06/18/12 2359   05/31/12 0000   sodium chloride 0.9 % SOLN 250 mL with vancomycin 10 G SOLR 1,250 mg        1,250 mg 166.7 mL/hr over 90 Minutes Intravenous Every 12 hours 05/30/12 1315 06/18/12 2359   05/30/12 2000   vancomycin (VANCOCIN) IVPB 1000 mg/200 mL premix  Status:  Discontinued        1,000 mg 200 mL/hr over 60 Minutes Intravenous Every 12 hours 05/30/12 1652 05/31/12 1339   05/27/12 1600   vancomycin (VANCOCIN) 1,250 mg in sodium chloride 0.9 % 250 mL IVPB  Status:  Discontinued        1,250 mg 166.7 mL/hr over 90 Minutes Intravenous Every  12 hours 05/27/12 1518 05/31/12 1336   05/26/12 0300   vancomycin (VANCOCIN) IVPB 1000 mg/200 mL premix  Status:  Discontinued        1,000 mg 200 mL/hr over 60 Minutes Intravenous Every 12 hours 05/25/12 1510 05/27/12 1518   05/25/12 1630   vancomycin (VANCOCIN) IVPB 1000 mg/200 mL premix         1,000 mg 200 mL/hr over 60 Minutes Intravenous Every 12 hours 05/25/12 1621 05/25/12 1721   05/22/12 1500   vancomycin (VANCOCIN) IVPB 1000 mg/200 mL premix  Status:  Discontinued        1,000 mg 200 mL/hr over 60 Minutes Intravenous Every 24 hours 05/22/12 1413 05/25/12 1510   05/21/12 1400   imipenem-cilastatin (PRIMAXIN) 500 mg in sodium chloride 0.9 % 100 mL IVPB  Status:  Discontinued        500 mg 200 mL/hr over 30 Minutes Intravenous 3 times per day 05/21/12 1136 05/21/12 1141   05/21/12 1400   imipenem-cilastatin (PRIMAXIN) 250 mg in sodium chloride 0.9 % 100 mL IVPB        250 mg 200 mL/hr over 30 Minutes Intravenous 3 times per day 05/21/12 1141     05/21/12 1300   micafungin (MYCAMINE) 100 mg in sodium chloride 0.9 % 100 mL IVPB  Status:  Discontinued        100 mg 100 mL/hr over 1 Hours Intravenous Daily 05/21/12 1136 05/25/12 1041          Assessment: 57 y.o. F who continues on Vancomycin per Rx + Primaxin day 11/28 for lung abscess s/p VATS, and debridement/drainage this admit.  Last Vancomycin trough was slightly SUPRAtherapeutic at 22.5 and the dose was decreased.  Scr=0.47, now stabilized.   Goal of Therapy:  Vancomycin trough level 15-20 mcg/ml  Plan:  1. Continue Vancomycin to 1g IV every 12 hours 2. BMET every 72 hours  3. Will recheck Vanc trough level at [facility].  4. Will continue to follow renal function, culture results, LOT, and antibiotic de-escalation plans   Wendie Simmer, PharmD, BCPS Clinical Pharmacist  Pager: [phone]

## 2012-06-01 NOTE — Progress Notes (Signed)
Patient d/c'd to SNF per MD order.  Right DL IJ d/c'd per MD order per policy patient tolerated well at 10:30.  Patient d/c'd via EMS with and family has belongings.  Report called to [facility] (SNF) and all questions answered.

## 2012-06-01 NOTE — Progress Notes (Signed)
Clinical Child psychotherapist (CSW) has prepared and placed pt dc packet in pt shadow chart. CSW has submitted dc summary and AVS to facility. RN to give report. CSW has contacted [facility] for transport. No additional CSW needs needed at this time.  CSW signing off.  Theresia Bough, MSW, Theresia Majors [phone]

## 2012-06-04 ENCOUNTER — Encounter (HOSPITAL_COMMUNITY): Payer: Self-pay

## 2012-06-04 ENCOUNTER — Emergency Department (HOSPITAL_COMMUNITY)
Admission: EM | Admit: 2012-06-04 | Discharge: 2012-06-04 | Disposition: A | Discharge status: Home / Self Care | Payer: Medicaid Other | Attending: Emergency Medicine | Admitting: Emergency Medicine

## 2012-06-04 DIAGNOSIS — Z862 Personal history of diseases of the blood and blood-forming organs and certain disorders involving the immune mechanism: Secondary | ICD-10-CM | POA: Insufficient documentation

## 2012-06-04 DIAGNOSIS — F341 Dysthymic disorder: Secondary | ICD-10-CM | POA: Insufficient documentation

## 2012-06-04 DIAGNOSIS — Z8709 Personal history of other diseases of the respiratory system: Secondary | ICD-10-CM | POA: Insufficient documentation

## 2012-06-04 DIAGNOSIS — Z5189 Encounter for other specified aftercare: Secondary | ICD-10-CM

## 2012-06-04 DIAGNOSIS — Z85118 Personal history of other malignant neoplasm of bronchus and lung: Secondary | ICD-10-CM | POA: Insufficient documentation

## 2012-06-04 DIAGNOSIS — M546 Pain in thoracic spine: Secondary | ICD-10-CM | POA: Insufficient documentation

## 2012-06-04 DIAGNOSIS — K219 Gastro-esophageal reflux disease without esophagitis: Secondary | ICD-10-CM | POA: Insufficient documentation

## 2012-06-04 DIAGNOSIS — J4489 Other specified chronic obstructive pulmonary disease: Secondary | ICD-10-CM | POA: Insufficient documentation

## 2012-06-04 DIAGNOSIS — Z8639 Personal history of other endocrine, nutritional and metabolic disease: Secondary | ICD-10-CM | POA: Insufficient documentation

## 2012-06-04 DIAGNOSIS — Z8701 Personal history of pneumonia (recurrent): Secondary | ICD-10-CM | POA: Insufficient documentation

## 2012-06-04 DIAGNOSIS — F17200 Nicotine dependence, unspecified, uncomplicated: Secondary | ICD-10-CM | POA: Insufficient documentation

## 2012-06-04 DIAGNOSIS — J449 Chronic obstructive pulmonary disease, unspecified: Secondary | ICD-10-CM | POA: Insufficient documentation

## 2012-06-04 DIAGNOSIS — Z79899 Other long term (current) drug therapy: Secondary | ICD-10-CM | POA: Insufficient documentation

## 2012-06-04 LAB — CBC WITH DIFFERENTIAL/PLATELET
Eosinophils Absolute: 0.6 10*3/uL (ref 0.0–0.7)
Lymphocytes Relative: 8 % — ABNORMAL LOW (ref 12–46)
Lymphs Abs: 1.6 10*3/uL (ref 0.7–4.0)
MCH: 29 pg (ref 26.0–34.0)
Neutrophils Relative %: 80 % — ABNORMAL HIGH (ref 43–77)
Platelets: 409 10*3/uL — ABNORMAL HIGH (ref 150–400)
RBC: 3.21 MIL/uL — ABNORMAL LOW (ref 3.87–5.11)
WBC: 18.8 10*3/uL — ABNORMAL HIGH (ref 4.0–10.5)

## 2012-06-04 LAB — COMPREHENSIVE METABOLIC PANEL
ALT: 6 U/L (ref 0–35)
Alkaline Phosphatase: 80 U/L (ref 39–117)
GFR calc Af Amer: >90 mL/min (ref >90)
Glucose, Bld: 91 mg/dL (ref 70–99)
Potassium: 3.9 mEq/L (ref 3.5–5.1)
Sodium: 134 mEq/L — ABNORMAL LOW (ref 135–145)
Total Protein: 7.5 g/dL (ref 6.0–8.3)

## 2012-06-04 MED ORDER — ALPRAZOLAM 0.25 MG PO TABS
1.0000 mg | ORAL_TABLET | Freq: Once | ORAL | Status: AC
  Administered 2012-06-04: 1 mg via ORAL
  Filled 2012-06-04: qty 1

## 2012-06-04 MED ORDER — SODIUM CHLORIDE 0.9 % IV BOLUS (SEPSIS)
1000.0000 mL | Freq: Once | INTRAVENOUS | Status: AC
  Administered 2012-06-04: 1000 mL via INTRAVENOUS

## 2012-06-04 MED ORDER — ONDANSETRON 4 MG PO TBDP
4.0000 mg | ORAL_TABLET | Freq: Once | ORAL | Status: AC
  Administered 2012-06-04: 4 mg via ORAL
  Filled 2012-06-04: qty 1

## 2012-06-04 NOTE — ED Provider Notes (Signed)
Medical screening examination/treatment/procedure(s) were conducted as a shared visit with non-physician practitioner(s) and myself.  I personally evaluated the patient during the encounter  [name] [name] is a 57 y.o. female hx of COPD, empyema s/p recent pneumonectomy here with wound check. The nursing home hasn't been cleaning the wound. No fever or chills. Vitals stable here. Mild purulent drainage from the wound. The PA called the PMD and Dr. Lowella Fairy (CT surgeon), who said to continue abx and have the nursing home change the dressing and clean the wound. WBC elevated at 18, but was elevated 17.5 on 1/17. She will continue abx and ct surgery will f/u with the patient.    Richardean Canal, MD 06/04/12 1710

## 2012-06-04 NOTE — ED Notes (Signed)
Per ems- Pt from [facility] SNF, pt has thoracostomy and requesting wound care on thoracostomy as it is not healing properly. BP-92/68 HR-78 R-16 O2-99% on RA Pt has hx of dementia.

## 2012-06-04 NOTE — ED Notes (Signed)
[facility] called to pick pt up and take back to nursing facitlity

## 2012-06-04 NOTE — ED Provider Notes (Signed)
History     CSN: [id number]  Arrival date & time 06/04/12  1359   First MD Initiated Contact with Patient 06/04/12 1521      Chief Complaint  Patient presents with  . Wound Check    (Consider location/radiation/quality/duration/timing/severity/associated sxs/prior treatment) HPI Comments: [name] [name] is a 57 year old female patient with a history of COPD, empyema and lung CA who presents to the emergency department for a wound check at the site on her right upper back where she had a thoracostomy on January 8th by Dr. Lowella Fairy.  Yesterday she began noticing pain at the site and states the nurses at her assisted living facility did not want to change the dressings on it due to them thinking she has a possible infection.  She denies chest pain, shortness of breath, fever, chills, nausea, vomiting and diarrhea.  Patient is a 57 y.o. female presenting with wound check. The history is provided by the patient.  Wound Check     Past Medical History  Diagnosis Date  . Depression with anxiety   . COPD (chronic obstructive pulmonary disease)   . Empyema   . GERD (gastroesophageal reflux disease)   . Lung cancer     RULobectomy 2001, stage I adenocarcinoma T2, N0, Mx    . H/O pulmonary fibrosis   . Tobacco use disorder   . Vocal cord paralysis   . Abnormal weight loss   . Pneumonia   . Pleural effusion   . Thyroid nodule     tiny on right   . Hydropneumothorax   . Normocytic anemia   . Hemoptysis   . Shortness of breath     with exertion    Past Surgical History  Procedure Date  . Lung lobectomy 2001    right upper lobe  . Thoracotomy     right side  . Forearm fracture surgery ~ 1975    "right" (05/21/2012)  . Peripherally inserted central catheter insertion   . Eye surgery 1979    corrected "Cross eyes"  . Video bronchoscopy 05/01/2012    Procedure: VIDEO BRONCHOSCOPY;  Surgeon: Delight Ovens, MD;  Location: [facility] OR;  Service: Thoracic;  Laterality: N/A;  Video  Bronchoscopy  . Video bronchoscopy 05/23/2012    Procedure: VIDEO BRONCHOSCOPY;  Surgeon: Delight Ovens, MD;  Location: [facility] OR;  Service: Thoracic;  Laterality: N/A;  . Video assisted thoracoscopy 05/23/2012    Procedure: VIDEO ASSISTED THORACOSCOPY;  Surgeon: Delight Ovens, MD;  Location: [facility] OR;  Service: Thoracic;  Laterality: Right;  . Eloessor 05/23/2012    Procedure: Lorenz Coaster PROCEDURE;  Surgeon: Delight Ovens, MD;  Location: [facility] OR;  Service: Thoracic;  Laterality: Right;  . Thoracotomy 05/23/2012    Procedure: THORACOTOMY MAJOR;  Surgeon: Delight Ovens, MD;  Location: [facility] OR;  Service: Thoracic;  Laterality: Right;  irrigation and debridment of right upper chest cavity    History reviewed. No pertinent family history.  History  Substance Use Topics  . Smoking status: Current Every Day Smoker -- 1.0 packs/day for 38 years    Types: Cigarettes  . Smokeless tobacco: Never Used  . Alcohol Use: No    OB History    Grav Para Term Preterm Abortions TAB SAB Ect Mult Living                  Review of Systems  Constitutional: Negative for fever and chills.  HENT: Negative for neck pain and neck stiffness.   Respiratory: Negative  for chest tightness and shortness of breath.   Cardiovascular: Negative for chest pain.  Gastrointestinal: Negative for diarrhea and blood in stool.  Skin: Positive for wound.  All other systems reviewed and are negative.    Allergies  Codeine; Other; and Spiriva  Home Medications   Current Outpatient Rx  Name  Route  Sig  Dispense  Refill  . ALBUTEROL SULFATE HFA 108 (90 BASE) MCG/ACT IN AERS   Inhalation   Inhale 2 puffs into the lungs every 4 (four) hours as needed. For shortness of breath         . ALBUTEROL SULFATE (5 MG/ML) 0.5% IN NEBU   Nebulization   Take 0.5 mLs (2.5 mg total) by nebulization every 6 (six) hours.   20 mL      . ALPRAZOLAM 1 MG PO TABS   Oral   Take 1.5 mg by mouth 3 (three) times daily.          .  BUDESONIDE 0.5 MG/2ML IN SUSP   Nebulization   Take 2 mLs (0.5 mg total) by nebulization 2 (two) times daily.         Marland Kitchen ENSURE COMPLETE PO LIQD   Oral   Take 237 mLs by mouth 2 (two) times daily between meals.         Marland Kitchen FOLIC ACID 1 MG PO TABS   Oral   Take 1 tablet (1 mg total) by mouth daily.         . GUAIFENESIN ER 600 MG PO TB12   Oral   Take 1 tablet (600 mg total) by mouth 2 (two) times daily.         . IBUPROFEN 600 MG PO TABS   Oral   Take 1 tablet (600 mg total) by mouth every 6 (six) hours as needed.   30 tablet      . IMIPENEM-CILASTATIN 250 MG IV SOLR      250 mg in 0.9 % NS 100 ml IVPB, administer intravenously for 30 min every 8 hours.  Completion Date is June 19, 2012   1 each   0     Dispense as written.   . IPRATROPIUM BROMIDE 0.02 % IN SOLN   Nebulization   Take 2.5 mLs (0.5 mg total) by nebulization every 6 (six) hours.   75 mL      . POLYSACCHARIDE IRON COMPLEX 150 MG PO CAPS   Oral   Take 1 capsule (150 mg total) by mouth daily.         . ADULT MULTIVITAMIN W/MINERALS CH   Oral   Take 1 tablet by mouth daily.         Marland Kitchen NICOTINE 14 MG/24HR TD PT24   Transdermal   Place 1 patch onto the skin daily.   28 patch      . OMEPRAZOLE 40 MG PO CPDR   Oral   Take 40 mg by mouth 2 (two) times daily.          . IMIPENEM-CILASTATIN IV 250 MG (MINIBAG PLUS)   Intravenous   Inject 250 mg into the vein every 8 (eight) hours.   250 mg      . VANCOMYCIN 1250 MG IVPB   Intravenous   Inject 1,250 mg into the vein every 12 (twelve) hours.         . TRAMADOL HCL 50 MG PO TABS   Oral   Take 1-2 tablets (50-100 mg total) by mouth every 6 (six) hours as  needed.   50 tablet   0   . VANCOMYCIN HCL IN DEXTROSE 1 GM/200ML IV SOLN      1000mg /200 ml NS premixed- run over 60 min Intravenously every 12 hours.  Completion date will be June 19, 2012   4000 mL   0     Dispense as written.     BP 115/72  Temp 98.8 F (37.1 C)  (Oral)  Resp 14  SpO2 91%  Physical Exam  Nursing note and vitals reviewed. Constitutional: She is oriented to person, place, and time. She appears well-developed. She appears cachectic. No distress.  HENT:  Head: Normocephalic and atraumatic.  Eyes: Conjunctivae normal and EOM are normal. Pupils are equal, round, and reactive to light.  Neck: Normal range of motion. Neck supple. No tracheal deviation present.  Cardiovascular: Normal rate, regular rhythm, normal heart sounds and intact distal pulses.   Pulmonary/Chest: Effort normal. No respiratory distress. She has no decreased breath sounds. She has rhonchi in the right upper field.    Abdominal: Soft. Bowel sounds are normal. There is no tenderness.  Musculoskeletal: Normal range of motion. She exhibits no edema.  Neurological: She is alert and oriented to person, place, and time.  Skin: Skin is warm and dry.  Psychiatric: Her speech is normal. Her mood appears anxious. She is is hyperactive.    ED Course  Procedures (including critical care time)  Labs Reviewed  CBC WITH DIFFERENTIAL - Abnormal; Notable for the following:    WBC 18.8 (*)     RBC 3.21 (*)     Hemoglobin 9.3 (*)     HCT 28.2 (*)     Platelets 409 (*)     Neutrophils Relative 80 (*)     Neutro Abs 15.1 (*)     Lymphocytes Relative 8 (*)     Monocytes Absolute 1.5 (*)     All other components within normal limits  COMPREHENSIVE METABOLIC PANEL - Abnormal; Notable for the following:    Sodium 134 (*)     BUN 5 (*)     Albumin 2.3 (*)     Total Bilirubin 0.2 (*)     All other components within normal limits  LACTIC ACID, PLASMA   No results found.   1. Encounter for wound care       MDM  57 y/o female with draining wound after bronchoscopy. Spoke with Dr. Lowella Fairy who states this is supposed to be draining a lot and it is supposed to be open. Vitals stable. She does not meet any SIRS criteria. He states the nursing facility has instructions to care  for the wound and drain it there. I also spoke with her PCP Dr. Reche Dixon who states patient has a long history of anxiety and she will see her back in the facility later today. Advised giving patient xanax here in the ED. Wound was drained in the ED. White count elevated at 18.8. It was 13.5 and 17.5 on 1/16 and 1/17 respectively. She is on imipenem-cilastatin and vancomycin currently. Patient stable to return to SNF. Case discussed with Dr. Silverio Lay who also evaluated patient and agrees with plan of care.        Trevor Mace, PA-C 06/04/12 1645  Trevor Mace, PA-C 06/04/12 1647  Trevor Mace, PA-C 06/04/12 1651

## 2012-06-13 LAB — AFB CULTURE WITH SMEAR (NOT AT ARMC): Acid Fast Smear: NONE SEEN

## 2012-06-14 ENCOUNTER — Ambulatory Visit: Payer: Medicaid Other | Admitting: Cardiothoracic Surgery

## 2012-06-15 ENCOUNTER — Ambulatory Visit (INDEPENDENT_AMBULATORY_CARE_PROVIDER_SITE_OTHER): Payer: Self-pay | Admitting: Cardiothoracic Surgery

## 2012-06-15 ENCOUNTER — Encounter: Payer: Self-pay | Admitting: Adult Health

## 2012-06-15 ENCOUNTER — Ambulatory Visit (INDEPENDENT_AMBULATORY_CARE_PROVIDER_SITE_OTHER): Payer: Medicaid Other | Admitting: Adult Health

## 2012-06-15 ENCOUNTER — Ambulatory Visit (INDEPENDENT_AMBULATORY_CARE_PROVIDER_SITE_OTHER)
Admission: RE | Admit: 2012-06-15 | Discharge: 2012-06-15 | Disposition: A | Discharge status: Home / Self Care | Payer: Medicaid Other | Source: Ambulatory Visit | Attending: Adult Health | Admitting: Adult Health

## 2012-06-15 ENCOUNTER — Encounter: Payer: Self-pay | Admitting: Cardiothoracic Surgery

## 2012-06-15 VITALS — BP 96/60 | HR 102 | Temp 97.7°F | Ht 65.5 in | Wt 86.6 lb

## 2012-06-15 VITALS — BP 98/68 | HR 107 | Resp 20 | Ht 65.0 in | Wt 86.0 lb

## 2012-06-15 DIAGNOSIS — J869 Pyothorax without fistula: Secondary | ICD-10-CM

## 2012-06-15 DIAGNOSIS — J852 Abscess of lung without pneumonia: Secondary | ICD-10-CM

## 2012-06-15 DIAGNOSIS — Z9889 Other specified postprocedural states: Secondary | ICD-10-CM

## 2012-06-15 DIAGNOSIS — Z09 Encounter for follow-up examination after completed treatment for conditions other than malignant neoplasm: Secondary | ICD-10-CM

## 2012-06-15 DIAGNOSIS — J449 Chronic obstructive pulmonary disease, unspecified: Secondary | ICD-10-CM

## 2012-06-15 NOTE — Patient Instructions (Signed)
No changes to wound care

## 2012-06-15 NOTE — Progress Notes (Addendum)
Subjective:    Patient ID: [name] [name], female    DOB: 12-27-1955, 57 y.o.   MRN: [id number]  HPI 57 year old, active smoker, with a history of lung cancer status post right upper lobectomy in 2001. She has a history of chronic pleural space infection with previous treatment in [place] and [place]. She was seen for pulmonary consultation during hospitalization for elective bronchoscopy on May 01 2012, which showed no airway obstruction, but significant pulmonary secretions. Signed out AMA . She was re -admitted January 6  For drainage of infected right pleural space. She underwent Bronchoscopy, right video-assisted thoracoscopy, right thoracotomy, drainage of empyema, and creation of Elosser flap.  She was weaned from ventilator without significant difficulty. She was discharged with a PICC line and 28 days of antibiotics, including vancomycin and imipenem. Completion date of IV antibiotics, repeat 06/19/2012. Patient was discharged to a skilled nursing facility.  Since discharge pt is feeling improved with decreased dyspnea and cough.  Remains on pulmicort and duoneb.  Does complain that ABX are upsetting stomach. Has loose stools.  No fever or bloody stools.  ABX d/c date 06/19/12.  She wishes to follow up with pulmonary in [facility] as previously seen once she is d/c from SNF.  She missed her ov with TS yesterday, their office was kind enough to see her today at 3 Pm this was arranged with her transportation service.  She denies hemoptysis, chest pain or edema.  She has wound dressing daily to chest wound. No redness or drainage from staple site.         Review of Systems Constitutional:   No  weight loss, night sweats,  Fevers, chills, + fatigue, or  lassitude.  HEENT:   No headaches,  Difficulty swallowing,  Tooth/dental problems, or  Sore throat,                No sneezing, itching, ear ache, nasal congestion, post nasal drip,   CV:  No chest pain,  Orthopnea,  PND, swelling in lower extremities, anasarca, dizziness, palpitations, syncope.   GI  No heartburn, indigestion,   loss of appetite, bloody stools.   Resp:   No coughing up of blood.  No change in color of mucus.  No wheezing.  No chest wall deformity  Skin: no rash or lesions.  GU: no dysuria, change in color of urine, no urgency or frequency.  No flank pain, no hematuria   MS:  No joint pain or swelling.  No decreased range of motion.  No back pain.  Psych:  No change in mood or affect. No depression or anxiety.  No memory loss.         Objective:   Physical Exam GEN: A/Ox3; pleasant , NAD, frail   HEENT:  [place]/AT,  EACs-clear, TMs-wnl, NOSE-clear, THROAT-clear, no lesions, no postnasal drip or exudate noted.   NECK:  Supple w/ fair ROM; no JVD; normal carotid impulses w/o bruits; no thyromegaly or nodules palpated; no lymphadenopathy.  RESP  Diminished BS in bases , no accessory muscle use, no dullness to percussion Along right chest wall open wound w/ dressing in place. Staples intact w/ no significant redness or drainage   CARD:  RRR, no m/r/g  , no peripheral edema, pulses intact, no cyanosis or clubbing.  GI:   Soft & nt; nml bowel sounds; no organomegaly or masses detected.  Musco: Warm bil, no deformities or joint swelling noted.   Neuro: alert, no focal deficits noted.  Skin: Warm, no lesions or rashes   CXR 06/15/2012  hydropneumothorax on the right  remains stable in appearance. There may well be superimposed  infection with empyema. There is no new opacity on the right, and  the air-fluid level appears stable.      Assessment & Plan:

## 2012-06-15 NOTE — Assessment & Plan Note (Addendum)
Right lung abscess/empyema s/p Right VATS -,  right thoracotomy, drainage of empyema, and creation of Elosser flap.   Cont on IV abx as planned 06/19/14  follow up ID clinic on 2/6  Ov with TS today at 3 PM .  CXR today w/out significant change in hydropneumothorax   Plan  Continue on current regimen  Follow up with [facility] once you have been discharged from Nursing Home in 2 weeks.  Follow up with Thoracic Surgeon , Dr. Tyrone Sage today at 2:00 pm  Begin Align Probiotic daily  Continue IV abx as planned through 06/19/12 .  follow up with Dr. Ninetta Lights ID 06/21/12 as planned  Please contact office for sooner follow up if symptoms do not improve or worsen or seek emergency care

## 2012-06-15 NOTE — Assessment & Plan Note (Signed)
Compensated without flare -cont on current regimen  follow up with [facility] in 2 weeks.  Pt to make appointment after discharge from NH per her request

## 2012-06-15 NOTE — Progress Notes (Signed)
[address]            [place] [address]          [phone]       [name] [name] [facility] Medical Record #[id number] Date of Birth: 1955-10-22  [name], MD [name], MD  Chief Complaint:   PostOp Follow Up Visit 05/23/2012   OPERATIVE REPORT  PREOPERATIVE DIAGNOSIS: Right chronic empyema, status post right upper  lobectomy.  POSTOPERATIVE DIAGNOSIS: Right chronic empyema, status post right upper  lobectomy.  SURGICAL PROCEDURE: Bronchoscopy, right video-assisted thoracoscopy,  right thoracotomy, drainage of empyema, and creation of Elosser flap.    History of Present Illness:     Patient returns to the office following drainage of chronic infected lung space and creation of Eloesser flap. Considering the patient's incredibly poor preoperative condition she is making reasonable recovery in spite of the challenges at the skilled nursing facility to care for her. She is very anxious to return to her home living situation. Overall she seems stronger less short of breath and less fatigued than on previous visits. She continues on IV antibiotics and local wound care to the Lopressor flap created to chronically draining the right pleural space.    History  Smoking status  . Current Every Day Smoker -- 1.0 packs/day for 38 years  . Types: Cigarettes  Smokeless tobacco  . Never Used       Allergies  Allergen Reactions  . Codeine Itching and Other (See Comments)    Severe anxiety  . Other Swelling and Other (See Comments)    Steroids  "feels like choking"  . Spiriva (Tiotropium Bromide Monohydrate) Other (See Comments)    Patient does not like and states it doesn't work for her    Current Outpatient Prescriptions  Medication Sig Dispense Refill  . albuterol (PROVENTIL HFA;VENTOLIN HFA) 108 (90 BASE) MCG/ACT inhaler Inhale 2 puffs into the lungs every 4 (four) hours as needed. For shortness of breath      . albuterol  (PROVENTIL) (5 MG/ML) 0.5% nebulizer solution Take 0.5 mLs (2.5 mg total) by nebulization every 6 (six) hours.  20 mL    . ALPRAZolam (XANAX) 1 MG tablet Take 1.5 mg by mouth 3 (three) times daily.       . budesonide (PULMICORT) 0.5 MG/2ML nebulizer solution Take 2 mLs (0.5 mg total) by nebulization 2 (two) times daily.      . feeding supplement (ENSURE COMPLETE) LIQD Take 237 mLs by mouth 2 (two) times daily between meals.      . folic acid (FOLVITE) 1 MG tablet Take 1 tablet (1 mg total) by mouth daily.      Marland Kitchen guaiFENesin (MUCINEX) 600 MG 12 hr tablet Take 1 tablet (600 mg total) by mouth 2 (two) times daily.      Marland Kitchen ibuprofen (ADVIL,MOTRIN) 600 MG tablet Take 1 tablet (600 mg total) by mouth every 6 (six) hours as needed.  30 tablet    . imipenem-cilastatin (PRIMAXIN) 250 MG injection 250 mg in 0.9 % NS 100 ml IVPB, administer intravenously for 30 min every 8 hours.  Completion Date is June 19, 2012  1 each  0  . ipratropium (ATROVENT) 0.02 % nebulizer solution Take 2.5 mLs (0.5 mg total) by nebulization every 6 (six) hours.  75 mL    . iron polysaccharides (NIFEREX) 150 MG capsule Take 1  capsule (150 mg total) by mouth daily.      . Multiple Vitamin (MULTIVITAMIN WITH MINERALS) TABS Take 1 tablet by mouth daily.      . nicotine (NICODERM CQ - DOSED IN MG/24 HOURS) 14 mg/24hr patch Place 1 patch onto the skin daily.  28 patch    . omeprazole (PRILOSEC) 40 MG capsule Take 40 mg by mouth 2 (two) times daily.       . sodium chloride 0.9 % SOLN 100 mL with imipenem-cilastatin 250 MG SOLR 250 mg Inject 250 mg into the vein every 8 (eight) hours.  250 mg    . sodium chloride 0.9 % SOLN 250 mL with vancomycin 10 G SOLR 1,250 mg Inject 1,250 mg into the vein every 12 (twelve) hours.      . traMADol (ULTRAM) 50 MG tablet Take 1-2 tablets (50-100 mg total) by mouth every 6 (six) hours as needed.  50 tablet  0  . vancomycin (VANCOCIN) 1 GM/200ML SOLN 1000mg /200 ml NS premixed- run over 60 min Intravenously  every 12 hours.  Completion date will be June 19, 2012  4000 mL  0       Physical Exam: BP 98/68  Pulse 107  Resp 20  Ht 5\' 5"  (1.651 m)  Wt 86 lb (39.009 kg)  BMI 14.31 kg/m2  SpO2 98%  General appearance: alert, cooperative, distracted and no distress Neurologic: intact Heart: regular rate and rhythm, S1, S2 normal, no murmur, click, rub or gallop and normal apical impulse Lungs: diminished breath sounds RML and RUL Abdomen: normal findings: aorta normal and bowel sounds normal Extremities: extremities normal, atraumatic, no cyanosis or edema and Homans sign is negative, no sign of DVT Wound: The patient's however was her flap is granulating on the lower edge remains open and draining the pleural space appropriately Wounds:  Diagnostic Studies & Laboratory data:         Recent Radiology Findings: Dg Chest 2 View  06/15/2012  *RADIOLOGY REPORT*  Clinical Data: Empyema  CHEST - 2 VIEW  Comparison:  June 01, 2012  Findings: The volume loss on the right with postoperative change remains.  Air fluid level consistent with a degree of hydropneumothorax is stable in appearance.  The right central catheter has been removed.  Left central catheter position is unchanged.  No pneumothorax is seen on the left.  Left lung is hyperexpanded but currently clear.  Heart size is normal.  Pulmonary vascularity is stable with postoperative change in the right.  Pulmonary vascularity is normal on the left.  No adenopathy is appreciable.  IMPRESSION: The area of apparent hydropneumothorax on the right remains stable in appearance. There may well be superimposed infection with empyema.  There is no new opacity on the right, and the air-fluid level appears stable.  Left lung is hyperexpanded but clear.  No pneumothorax.   Original Report Authenticated By: Bretta Bang, M.D.    On my review the patient's chest x-ray remained stable with the Eloesser flap open and draining a chronic empyema space in  the right upper chest  Recent Labs: Lab Results  Component Value Date   WBC 18.8* 06/04/2012   HGB 9.3* 06/04/2012   HCT 28.2* 06/04/2012   PLT 409* 06/04/2012   GLUCOSE 91 06/04/2012   ALT 6 06/04/2012   AST 13 06/04/2012   NA 134* 06/04/2012   K 3.9 06/04/2012   CL 98 06/04/2012   CREATININE 0.56 06/04/2012   BUN 5* 06/04/2012   CO2 27 06/04/2012  INR 0.98 05/22/2012      Assessment / Plan:     Overall considering the patient's poor medical status the IV antibiotics and keeping the right pleural space drained she appears to be stronger than preoperatively We will continue with the current wound therapy, she is to see infectious disease next week when a decision about stopping antibiotics can be made. The patient is anxious to return to her home situation as she does not like the skilled nursing facility, however she does seem to be better under the regimented care   of plan to see her back in one month with a followup chest x-ray .   [name] [name] 06/15/2012 3:43 PM

## 2012-06-15 NOTE — Patient Instructions (Addendum)
Continue on current regimen  Follow up with [facility] once you have been discharged from Nursing Home in 2 weeks.  Follow up with Thoracic Surgeon , Dr. Tyrone Sage today at 2:00 pm  Begin Align Probiotic daily  Continue IV abx as planned through 06/19/12 .  follow up with Dr. Ninetta Lights ID 06/21/12 as planned  Please contact office for sooner follow up if symptoms do not improve or worsen or seek emergency care

## 2012-06-19 NOTE — Progress Notes (Signed)
Agree with plan - further FU with [facility]

## 2012-06-20 LAB — FUNGUS CULTURE W SMEAR: Fungal Smear: NONE SEEN

## 2012-06-21 ENCOUNTER — Ambulatory Visit (INDEPENDENT_AMBULATORY_CARE_PROVIDER_SITE_OTHER): Payer: Self-pay | Admitting: Cardiothoracic Surgery

## 2012-06-21 ENCOUNTER — Ambulatory Visit: Payer: Medicaid Other | Admitting: Cardiothoracic Surgery

## 2012-06-21 ENCOUNTER — Ambulatory Visit (INDEPENDENT_AMBULATORY_CARE_PROVIDER_SITE_OTHER): Payer: Medicaid Other | Admitting: Infectious Diseases

## 2012-06-21 ENCOUNTER — Inpatient Hospital Stay: Payer: Medicaid Other | Admitting: Infectious Diseases

## 2012-06-21 ENCOUNTER — Telehealth: Payer: Self-pay | Admitting: *Deleted

## 2012-06-21 ENCOUNTER — Encounter: Payer: Self-pay | Admitting: Infectious Diseases

## 2012-06-21 VITALS — BP 131/83 | HR 83 | Temp 97.5°F | Ht 65.0 in | Wt 89.0 lb

## 2012-06-21 DIAGNOSIS — J869 Pyothorax without fistula: Secondary | ICD-10-CM

## 2012-06-21 DIAGNOSIS — D381 Neoplasm of uncertain behavior of trachea, bronchus and lung: Secondary | ICD-10-CM

## 2012-06-21 DIAGNOSIS — J852 Abscess of lung without pneumonia: Secondary | ICD-10-CM

## 2012-06-21 MED ORDER — HYDROCODONE-ACETAMINOPHEN 7.5-500 MG PO TABS: 1.0000 | ORAL_TABLET | Freq: Four times a day (QID) | ORAL | Status: DC | PRN

## 2012-06-21 MED ORDER — AMOXICILLIN-POT CLAVULANATE 875-125 MG PO TABS: 1.0000 | ORAL_TABLET | Freq: Two times a day (BID) | ORAL | Status: DC

## 2012-06-21 NOTE — Telephone Encounter (Signed)
Called and left patient a voice mail regarding her appt at [facility] for Monday 06/25/12 at 2:45 pm with Wyline Copas, PA. Their office told me that patient was already aware of the appt, but when she was here she said she needed an appointment at wound care. [name] [name]

## 2012-06-21 NOTE — Progress Notes (Signed)
  Subjective:    Patient ID: [name] [name], female    DOB: [birth year], 57 y.o.   MRN: [id number]  HPI 57 yo F with a history of lung cancer status post right upper lobectomy in 2001.  She has a history of chronic pleural space infection with previous treatment in [place] and [place].  She was hospitalized for elective bronchoscopy May 01 2012, which showed no airway obstruction, but significant pulmonary secretions. Signed out AMA . She was re -admitted January 6 for drainage of infected right pleural space. She underwent Bronchoscopy, right video-assisted thoracoscopy, right thoracotomy, drainage of empyema, and creation of Elosser flap. Her cultures were negative for AFB, fungus and rountine.  She was discharged with a PICC line to a SNF, to complete 28 days of antibiotics (vancomycin and imipenem). She states she quit taking her anbx 1 week ago. Was giving her diarrhea.  Has been having trouble with food at SNF. She wants to get out of SNF now, wants to f/u at [facility].  States she was seen by Dr Tyrone Sage and that her "lung infection is cleared".  Last CXR (06-15-12): The area of apparent hydropneumothorax on the right remains stable in appearance. There may well be superimposed infection with empyema. There is no new opacity on the right, and the air-fluid level appears stable. Left lung is hyperexpanded but clear. No pneumothorax. States she is not getting wound care or pain relief at SNF.     Review of Systems  Constitutional: Negative for fever, chills, appetite change and unexpected weight change.  Respiratory: Negative for cough and shortness of breath.   Cardiovascular: Negative for chest pain.  Gastrointestinal: Negative for diarrhea and constipation.  Genitourinary: Negative for difficulty urinating.       Objective:   Physical Exam  Constitutional: She appears cachectic.  HENT:  Mouth/Throat: No oropharyngeal exudate.  Eyes: EOM are normal. Pupils are equal, round, and  reactive to light.  Neck: Neck supple.  Cardiovascular: Normal rate, regular rhythm and normal heart sounds.   Pulmonary/Chest: Effort normal.    Abdominal: Soft. Bowel sounds are normal. There is no tenderness.  Lymphadenopathy:    She has no cervical adenopathy.  Skin:             Assessment & Plan:

## 2012-06-21 NOTE — Progress Notes (Signed)
RN received verbal order to discontinue the patient's PICC line.  Patient identified with name and date of birth. PICC dressing removed, site unremarkable.  PICC line removed using sterile procedure @ 1130. PICC length equal to that noted in patient's hospital chart of 47 cm. Sterile petroleum gauze + sterile 4X4 applied to PICC site, pressure applied for 10 minutes and covered with Medipore tape as a pressure dressing. Patient tolerated procedure without complaints.  Patient instructed to limit use of arm for 1 hour. Patient instructed that the pressure dressing should remain in place for 24 hours. Patient verbalized understanding of these instructions.

## 2012-06-21 NOTE — Assessment & Plan Note (Addendum)
Will- pull her PIC. Start augmentin with plan she will be on this for 6 months. She states that her lung is clear of infection, I am doubtful of this given the chronicity of this and the continued fluid on her CXR. She denies drug allergy. She is adamant that she wants to be out of SNF today. She will f/u with Dr Dennie Maizes nursing staff about getting home health set up. She wishes to follow up at [facility] wound center, states [place] doctors are all in [place]. I will allow them to assume her care from this point forward (lung infection, anorexia/malnutrition, smoking/COPD).  She asks for pain rx today which I did NOT write for her.

## 2012-06-21 NOTE — Telephone Encounter (Signed)
SNF faxing paperwork.  Pt has [facility] appt today.

## 2012-06-26 ENCOUNTER — Encounter: Payer: Self-pay | Admitting: Cardiothoracic Surgery

## 2012-06-26 NOTE — Progress Notes (Signed)
[address]            [place] [address]          [phone]         [name] [name] [facility] Medical Record #[id number] Date of Birth: 1956-02-21  [name] Creek, MD [name] Creek, MD  Chief Complaint:   PostOp Follow Up Visit 05/23/2012   OPERATIVE REPORT  PREOPERATIVE DIAGNOSIS: Right chronic empyema, status post right upper  lobectomy.  POSTOPERATIVE DIAGNOSIS: Right chronic empyema, status post right upper  lobectomy.  SURGICAL PROCEDURE: Bronchoscopy, right video-assisted thoracoscopy,  right thoracotomy, drainage of empyema, and creation of Elosser flap.    History of Present Illness:     Patient returns to the office following drainage of chronic infected lung space and creation of Eloesser flap. Considering the patient's incredibly poor preoperative condition she is making reasonable recovery in spite of the challenges at the skilled nursing facility to care for her. She is very anxious to return to her home living situation. Overall she seems stronger less short of breath and less fatigued than on previous visits. She continues on IV antibiotics and local wound care to the Lopressor flap created to chronically draining the right pleural space.    History  Smoking status  . Current Every Day Smoker -- 0.30 packs/day for 38 years  . Types: Cigarettes  Smokeless tobacco  . Never Used       Allergies  Allergen Reactions  . Codeine Itching and Other (See Comments)    Severe anxiety  . Other Swelling and Other (See Comments)    Steroids  "feels like choking"  . Spiriva (Tiotropium Bromide Monohydrate) Other (See Comments)    Patient does not like and states it doesn't work for her    Current Outpatient Prescriptions  Medication Sig Dispense Refill  . albuterol (PROVENTIL HFA;VENTOLIN HFA) 108 (90 BASE) MCG/ACT inhaler Inhale 2 puffs into the lungs every 4 (four) hours as needed. For shortness of breath      .  albuterol (PROVENTIL) (5 MG/ML) 0.5% nebulizer solution Take 0.5 mLs (2.5 mg total) by nebulization every 6 (six) hours.  20 mL    . ALPRAZolam (XANAX) 1 MG tablet Take 1.5 mg by mouth 3 (three) times daily.       Marland Kitchen amoxicillin-clavulanate (AUGMENTIN) 875-125 MG per tablet Take 1 tablet by mouth 2 (two) times daily.  120 tablet  3  . budesonide (PULMICORT) 0.5 MG/2ML nebulizer solution Take 2 mLs (0.5 mg total) by nebulization 2 (two) times daily.      . feeding supplement (ENSURE COMPLETE) LIQD Take 237 mLs by mouth 2 (two) times daily between meals.      . folic acid (FOLVITE) 1 MG tablet Take 1 tablet (1 mg total) by mouth daily.      Marland Kitchen guaiFENesin (MUCINEX) 600 MG 12 hr tablet Take 1 tablet (600 mg total) by mouth 2 (two) times daily.      Marland Kitchen HYDROcodone-acetaminophen (LORTAB 7.5) 7.5-500 MG per tablet Take 1 tablet by mouth every 6 (six) hours as needed for pain.  40 tablet  0  . ibuprofen (ADVIL,MOTRIN) 600 MG tablet Take 1 tablet (600 mg total) by mouth every 6 (six) hours as needed.  30 tablet    . ipratropium (ATROVENT) 0.02 % nebulizer solution Take 2.5 mLs (0.5 mg total) by nebulization every 6 (six) hours.  75 mL    . iron polysaccharides (NIFEREX) 150 MG capsule Take 1 capsule (150 mg total) by mouth daily.      . Multiple Vitamin (MULTIVITAMIN WITH MINERALS) TABS Take 1 tablet by mouth daily.      . nicotine (NICODERM CQ - DOSED IN MG/24 HOURS) 14 mg/24hr patch Place 1 patch onto the skin daily.  28 patch    . omeprazole (PRILOSEC) 40 MG capsule Take 40 mg by mouth 2 (two) times daily.       . traMADol (ULTRAM) 50 MG tablet Take 1-2 tablets (50-100 mg total) by mouth every 6 (six) hours as needed.  50 tablet  0  . vancomycin (VANCOCIN) 1 GM/200ML SOLN 1000mg /200 ml NS premixed- run over 60 min Intravenously every 12 hours.  Completion date will be June 19, 2012  4000 mL  0   No current facility-administered medications for this visit.       Physical Exam: There were no vitals  taken for this visit.  General appearance: alert, cooperative, distracted and no distress Neurologic: intact Heart: regular rate and rhythm, S1, S2 normal, no murmur, click, rub or gallop and normal apical impulse Lungs: diminished breath sounds RML and RUL Abdomen: normal findings: aorta normal and bowel sounds normal Extremities: extremities normal, atraumatic, no cyanosis or edema and Homans sign is negative, no sign of DVT Wound: The patient's however was her flap is granulating on the lower edge remains open and draining the pleural space appropriately Wounds:rt posterior chest flap is open lower edge is granulating, irrigated with out difficulity   Diagnostic Studies & Laboratory data:         Recent Radiology Findings: No results found. On my review the patient's chest x-ray remained stable with the Eloesser flap open and draining a chronic empyema space in the right upper chest  Recent Labs: Lab Results  Component Value Date   WBC 18.8* 06/04/2012   HGB 9.3* 06/04/2012   HCT 28.2* 06/04/2012   PLT 409* 06/04/2012   GLUCOSE 91 06/04/2012   ALT 6 06/04/2012   AST 13 06/04/2012   NA 134* 06/04/2012   K 3.9 06/04/2012   CL 98 06/04/2012   CREATININE 0.56 06/04/2012   BUN 5* 06/04/2012   CO2 27 06/04/2012   INR 0.98 05/22/2012      Assessment / Plan:     Overall considering the patient's poor medical status the IV antibiotics and keeping the right pleural space drained she appears to be stronger than preoperatively We will continue with the current wound therapy, she saw  infectious disease  a decisionwas made to stop antibiotics and remove PIC . The patient is anxious to return to her home situation as she does not like the skilled nursing facility, Patient will go home from SNF and will be seen at [facility] wound care center to check on her incision. Clinic is close to her home and she can  get to with out car. I will see her back with chest xray in 3-4 weeks    [name] [name]  B 06/26/2012 2:10 PM

## 2012-07-06 LAB — AFB CULTURE WITH SMEAR (NOT AT ARMC): Acid Fast Smear: NONE SEEN

## 2012-07-11 ENCOUNTER — Telehealth: Payer: Self-pay | Admitting: *Deleted

## 2012-07-11 NOTE — Telephone Encounter (Signed)
Call from [place] at [facility], patient has refused the referral, stating she would have it taken care of by her PCP Dr. Linton Rump. Wendall Mola

## 2012-07-17 ENCOUNTER — Other Ambulatory Visit: Payer: Self-pay | Admitting: *Deleted

## 2012-07-17 DIAGNOSIS — C3490 Malignant neoplasm of unspecified part of unspecified bronchus or lung: Secondary | ICD-10-CM

## 2012-07-19 ENCOUNTER — Encounter: Payer: Self-pay | Admitting: Cardiothoracic Surgery

## 2012-07-19 ENCOUNTER — Ambulatory Visit
Admission: RE | Admit: 2012-07-19 | Discharge: 2012-07-19 | Disposition: A | Discharge status: Home / Self Care | Payer: Medicaid Other | Source: Ambulatory Visit | Attending: Cardiothoracic Surgery | Admitting: Cardiothoracic Surgery

## 2012-07-19 ENCOUNTER — Ambulatory Visit (INDEPENDENT_AMBULATORY_CARE_PROVIDER_SITE_OTHER): Payer: Self-pay | Admitting: Cardiothoracic Surgery

## 2012-07-19 ENCOUNTER — Other Ambulatory Visit: Payer: Self-pay | Admitting: Cardiothoracic Surgery

## 2012-07-19 VITALS — BP 113/74 | HR 100 | Temp 97.7°F | Resp 18 | Ht 65.0 in | Wt 84.0 lb

## 2012-07-19 DIAGNOSIS — Z09 Encounter for follow-up examination after completed treatment for conditions other than malignant neoplasm: Secondary | ICD-10-CM

## 2012-07-19 DIAGNOSIS — J869 Pyothorax without fistula: Secondary | ICD-10-CM

## 2012-07-19 DIAGNOSIS — C3490 Malignant neoplasm of unspecified part of unspecified bronchus or lung: Secondary | ICD-10-CM

## 2012-07-19 NOTE — Progress Notes (Addendum)
[address]       [place] [address]             [phone]          [facility] Medical Record #[id number] Date of Birth: 22-Apr-1956  [name], MD [name], MD  Chief Complaint:   PostOp Follow Up Visit 05/23/2012   OPERATIVE REPORT  PREOPERATIVE DIAGNOSIS: Right chronic empyema, status post right upper  lobectomy.  POSTOPERATIVE DIAGNOSIS: Right chronic empyema, status post right upper  lobectomy.  SURGICAL PROCEDURE: Bronchoscopy, right video-assisted thoracoscopy,  right thoracotomy, drainage of empyema, and creation of Elosser flap.    History of Present Illness:     Patient returns to the office following drainage of chronic infected lung space and creation of Eloesser flap. Over the past week she has noted increasing fatigue and feeling worse. She's been having the empyema cavity flushed out and Dr. Benjaman Lobe office closer to her home.    History  Smoking status  . Current Every Day Smoker -- 0.30 packs/day for 38 years  . Types: Cigarettes  Smokeless tobacco  . Never Used       Allergies  Allergen Reactions  . Codeine Itching and Other (See Comments)    Severe anxiety  . Other Swelling and Other (See Comments)    Steroids  "feels like choking"  . Spiriva (Tiotropium Bromide Monohydrate) Other (See Comments)    Patient does not like and states it doesn't work for her    Current Outpatient Prescriptions  Medication Sig Dispense Refill  . albuterol (PROVENTIL HFA;VENTOLIN HFA) 108 (90 BASE) MCG/ACT inhaler Inhale 2 puffs into the lungs every 6 (six) hours as needed for wheezing.      Marland Kitchen ALPRAZolam (XANAX) 1 MG tablet Take 1.5 mg by mouth 3 (three) times daily.       Marland Kitchen oxyCODONE (OXYCONTIN) 10 MG 12 hr tablet Take 10 mg by mouth every 12 (twelve) hours.       No current facility-administered medications for this visit.       Physical Exam: BP 113/74  Pulse 100  Temp(Src) 97.7 F (36.5 C) (Oral)   Resp 18  Ht 5\' 5"  (1.651 m)  Wt 84 lb (38.102 kg)  BMI 13.98 kg/m2  SpO2 95% Wt Readings from Last 3 Encounters:  07/19/12 84 lb (38.102 kg)  06/21/12 89 lb (40.37 kg)  06/15/12 86 lb (39.009 kg)    General appearance: alert, cooperative, distracted and no distress Neurologic: intact Heart: regular rate and rhythm, S1, S2 normal, no murmur, click, rub or gallop and normal apical impulse Lungs: diminished breath sounds RML and RUL Abdomen: normal findings: aorta normal and bowel sounds normal Extremities: extremities normal, atraumatic, no cyanosis or edema and Homans sign is negative, no sign of DVT Wound: The patient's however was her flap is granulating on the lower edge remains open and draining the pleural space appropriately Wounds:rt posterior chest flap is open lower edge is granulating, irrigated with out difficulity  The the right chest opening had granulation tissue which was developing and had partially closed the Eloesser flap opening, in the office this was opened further and the chest irrigated with significant amount of purulent material returned.  Diagnostic Studies & Laboratory data:         Recent Radiology Findings: Dg Chest 2 View  07/19/2012  *RADIOLOGY REPORT*  Clinical Data: History of chronic right sided empyema, status post right upper lobectomy, follow-up postop, shortness  of breath  CHEST - 2 VIEW  Comparison: Chest x-ray of 06/15/2012  Findings: There is volume loss throughout the right hemithorax with probable loculated right hydropneumothoraces. The left lung is clear.  Heart size is stable.  Multiple surgical clips overlie the right hilum.  IMPRESSION:  Volume loss on the right with probable loculated right hydropneumothoraces.   Original Report Authenticated By: Dwyane Dee, M.D.   On my review the patient's chest x-ray remained stable with the Eloesser flap open and draining a chronic empyema space in the right upper chest  Recent Labs: Lab Results    Component Value Date   WBC 18.8* 06/04/2012   HGB 9.3* 06/04/2012   HCT 28.2* 06/04/2012   PLT 409* 06/04/2012   GLUCOSE 91 06/04/2012   ALT 6 06/04/2012   AST 13 06/04/2012   NA 134* 06/04/2012   K 3.9 06/04/2012   CL 98 06/04/2012   CREATININE 0.56 06/04/2012   BUN 5* 06/04/2012   CO2 27 06/04/2012   INR 0.98 05/22/2012      Assessment / Plan:    Overall considering the patient's poor medical status she seems to be doing reasonably well, I'm concerned that granulation tissue that built up with preventing the right pleural space from draining completely this should improve now. I have discussed with  Dr. Reche Dixon,  depending on the cultures that we could use  antibiotic irrigation. To insure good drainage of the space if she moves her right arm forward the opening enlarges and drains better I discussed this with her so she can cooperate with her care. I will see her back with chest xray in 3 weeks    GERHARDT,EDWARD B 07/19/2012 1:42 PM

## 2012-07-23 LAB — CULTURE, ROUTINE-ABSCESS
Gram Stain: NONE SEEN
Gram Stain: NONE SEEN
Gram Stain: NONE SEEN

## 2012-08-16 ENCOUNTER — Ambulatory Visit: Payer: Medicaid Other | Admitting: Cardiothoracic Surgery

## 2012-10-12 ENCOUNTER — Encounter (HOSPITAL_COMMUNITY): Payer: Self-pay | Admitting: *Deleted

## 2013-07-02 ENCOUNTER — Encounter: Payer: Self-pay | Admitting: *Deleted

## 2013-07-04 ENCOUNTER — Ambulatory Visit: Payer: Medicaid Other | Attending: Gynecologic Oncology | Admitting: Gynecologic Oncology

## 2013-07-04 ENCOUNTER — Encounter: Payer: Self-pay | Admitting: Gynecologic Oncology

## 2013-07-04 VITALS — BP 129/66 | HR 92 | Temp 98.8°F | Resp 16 | Ht 64.92 in | Wt 79.3 lb

## 2013-07-04 DIAGNOSIS — J860 Pyothorax with fistula: Secondary | ICD-10-CM | POA: Insufficient documentation

## 2013-07-04 DIAGNOSIS — J4489 Other specified chronic obstructive pulmonary disease: Secondary | ICD-10-CM | POA: Insufficient documentation

## 2013-07-04 DIAGNOSIS — K219 Gastro-esophageal reflux disease without esophagitis: Secondary | ICD-10-CM | POA: Insufficient documentation

## 2013-07-04 DIAGNOSIS — J449 Chronic obstructive pulmonary disease, unspecified: Secondary | ICD-10-CM | POA: Insufficient documentation

## 2013-07-04 DIAGNOSIS — Z85118 Personal history of other malignant neoplasm of bronchus and lung: Secondary | ICD-10-CM | POA: Insufficient documentation

## 2013-07-04 DIAGNOSIS — F341 Dysthymic disorder: Secondary | ICD-10-CM | POA: Insufficient documentation

## 2013-07-04 DIAGNOSIS — Z79899 Other long term (current) drug therapy: Secondary | ICD-10-CM | POA: Insufficient documentation

## 2013-07-04 DIAGNOSIS — F17200 Nicotine dependence, unspecified, uncomplicated: Secondary | ICD-10-CM | POA: Insufficient documentation

## 2013-07-04 DIAGNOSIS — C539 Malignant neoplasm of cervix uteri, unspecified: Secondary | ICD-10-CM | POA: Insufficient documentation

## 2013-07-04 NOTE — Patient Instructions (Signed)
Follow up with Dr. Skeet Latch in three months or sooner if needed.    Appointment at [facility] with Dr. Malva Limes in [place] on Feb. 25, Wed. with 1:30pm arrival time.  They would like you to obtain your scans from [facility] and have them mailed to Radiation Oncology, [facility], [address], [place], [place] [address].  Dr. Karl Bales office phone number is [phone].  We will contact you once an appointment has been arranged at [facility] with a Medical Oncologist that would give you weekly cisplatin while you are receiving radiation.    Cervical Cancer The cervix is the opening and bottom part of the uterus between the vagina and the uterus. Cervical cancer is a fairly common cancer. It occurs most often in women between the ages of 61 years and 52 years. Cells of the cervix act very much like skin cells. These cells are exposed to toxins, viruses, and bacteria that may cause abnormal changes.  There are two kinds of cancers of the cervix:   Squamous cell carcinoma This type of cancer starts in the flat or scale-like cells that line the cervix. Squamous cell carcinoma can develop from a sexually transmitted infection caused by the human papillomavirus (HPV).   Adenocarcinoma This type of cervical cancer starts in glandular cells that line the cervix. RISK FACTORS The risk of getting cancer of the cervix is related to your lifestyle, sexual history, health, and immune system. Risks for cervical cancer include:   Having a sexually transmitted viral infection. These include:  Chlamydia.   Herpes.   HPV.  Becoming sexually active before age 59 years.   Having more than one sexual partner or having sex with someone who has more than one sexual partner.   Not using condoms with sexual partners.   Having had cancer of the vagina or vulva.   Having a sexual partner who has or had cancer of the penis or who has had a sexual partner with cervical  dysplasia or cervical cancer.   Using oral contraceptives (also called birth control pills).  Smoking.   Having a weakened immune system. For example, human immunodeficiency virus (HIV) or other immune deficiency disorders.   Being the daughter of a woman who took diethylstilbestrol (DES) during pregnancy.   Having a sister or mother who has had cancer of the cervix.   Being [place] American, Hispanic, Asian, or a woman from the [place].   A history of dysplasia of the cervix. SIGNS AND SYMPTOMS  Symptoms are usually not present in the early stages of cervical cancer. Once the cancer invades the cervix and surrounding tissues, the woman may have:   Abnormal vaginal bleeding or menstrual bleeding that is longer or heavier than usual.   Bleeding after intercourse, douching, or a Pap test.   Vaginal bleeding following menopause.   Abnormal vaginal discharge.  Pelvic discomfort or pain.  An abnormal Pap test.  Pain during sexual intercourse. Symptoms of more advanced cervical cancer may include:   Loss of appetite or weight loss.   Tiredness (fatigue).   Back and leg pain.   Inability to control urination or bowel movements. DIAGNOSIS  A pelvic exam and Pap test are done to diagnose the condition. If abnormalities are found during the exam or Pap test, the Pap test may be repeated in 3 months, or your health care provider may do additional tests or procedures, such as:   A colposcopy This is a procedure that  uses a special microscope that allows the health care provider to magnify and closely examine the cells of the cervix, vagina, and vulva.   Cervical biopsies This is a procedure where small tissue samples are taken from the cervix to be examined under a microscope by a specialist.   A cone biopsy This is a procedure to test for or remove cancerous tissue.  Other tests may be needed, including:   Cystoscopy.   Proctoscopy or sigmoidoscopy.    Ultrasound.   CT scan.   MRI.   Laparoscopy.  There are different stages of cervical cancer:   Stage 0, carcinoma in situ (CIS) This first stage of cancer is the last and most serious stage of dysplasia.   Stage 1 This means the tumor is in the uterus and cervix only.   Stage 2 This means the tumor has spread to the upper vagina. The cancer has spread beyond the uterus, but not to the pelvic walls or lower third of the vagina.   Stage 3 This means the tumor has invaded the side wall of the pelvis and the lower third of the vagina. Blockage of the tubes that carry urine (ureters) from the tumor may cause urine to back up and cause the kidneys to swell (hydronephrosis).   Stage 4 This means the tumor has spread to the rectum or bladder. In the later part of this stage, it has also spread to distant organs, like the lungs.  TREATMENT  Treatment options can include:   Cone biopsy to remove the cancerous tissue.   Removal of the entire uterus and cervix.   Removal of the uterus, cervix, upper vagina, lymph nodes, and surrounding tissue (modified radical hysterectomy). The ovaries may be left in place or removed.   Medicines to treat cancer.   A combination of surgery, radiation, and chemotherapy.   Biological response modifiers. These are substances that help strengthen your immune system's fight against cancer or infection. They may be used in combination with chemotherapy.  HOME CARE INSTRUCTIONS   Get a gynecology exam and Pap test once every year or as directed by your health care provider.   Get the HPV vaccine.   Do not smoke.  Do not have sexual intercourse until your health care provider says it is okay.  Use a condom every time you have sex. SEEK MEDICAL CARE IF:   You have increased pelvic pain or pressure.   Your are becoming increasingly tired.   You have increased leg or back pain.   You have a fever.  You have abnormal bleeding  or discharge.  You lose weight. SEEK IMMEDIATE MEDICAL CARE IF:   You cannot urinate.  You have blood in your urine.   You have blood or pressure with a bowel movement.   You develop severe back, stomach, or pelvic pain. Document Released: 05/02/2005 Document Revised: 01/02/2013 Document Reviewed: 10/24/2012 [facility] Patient Information 2014 [place].

## 2013-07-04 NOTE — Progress Notes (Signed)
Consult Note: Gyn-Onc  Consult was requested by Dr. Jobe Igo  for the evaluation of [name] [name] 58 y.o. female  CC:  Chief Complaint  Patient presents with  . Cervical Cancer    New Consult    Assessment/Plan:  [name] [name]  is a 58 y.o.  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 Treatment has been delayed because she desires treatment at [facility]. At this visit the overall assessment is that there is tumor replacing the cervix and upper vagina and represents progression since her initial presentation..  A fixed  central mass is appreciated on imaging and examination.  It is unclear whether this represents tumor invasion of the uterus or extension outside the uterus.  There is no evidence of pelvic lymph node metastases however there is mild hypermetabolic activity in a left periaortic lymph node.  Recommendations at this patient received chemoradiation. Strong consideration could be given to including the left periaortic lymph nodes, however given the absence of pelvic lymph nodes it's possible that this may not represent metastatic disease. The patient is advised to discontinue or reduce her tobacco use.  Right broncho-cutaneous fistula The area appeared clean and a dressing was replaced.   HPI:  Pap 04/04/2013 high-grade squamous intraepithelial lesion Cervical biopsy 05/13/2013 squamous cell carcinoma with epithelial invasion.  She was seen at the [facility] by  Dr. Randalyn Rhea. At that visit physical examination is notable for 4.5 cm cervix with evidence of cancer extending to the vaginal sidewall and involving approximately 1 cm the upper vagina and approximately one centimeters left sided parametrial involvement. She was referred to radiation oncology. PET/CT  on 06/17/2013 and is notable for soft tissue mass in the presacral space measuring 4.9 x 5.6 cm it exerts a leftward mass effect on the  rectosigmoid colon is contiguous with the uterus as possible metastatic left periaortic lymph node( mild hypermetabolic activity in that area) pelvic adenopathy was appreciated  Past medical history is noted for COPD status post left upper lobectomy 15 years ago for lung cancer.   Current Meds:  Outpatient Encounter Prescriptions as of 07/04/2013  Medication Sig  . albuterol (PROVENTIL HFA;VENTOLIN HFA) 108 (90 BASE) MCG/ACT inhaler Inhale 2 puffs into the lungs every 6 (six) hours as needed for wheezing.  Marland Kitchen ALPRAZolam (XANAX) 1 MG tablet Take 1.5 mg by mouth 3 (three) times daily.   Marland Kitchen oxyCODONE (OXYCONTIN) 10 MG 12 hr tablet Take 10 mg by mouth every 12 (twelve) hours.  Marland Kitchen omeprazole (PRILOSEC) 20 MG capsule Take 20 mg by mouth daily.    Allergy:  Allergies  Allergen Reactions  . Codeine Itching and Other (See Comments)    Severe anxiety  . Other Swelling and Other (See Comments)    Steroids  "feels like choking"  . Spiriva [Tiotropium Bromide Monohydrate] Other (See Comments)    Patient does not like and states it doesn't work for her  . Vicodin [Hydrocodone-Acetaminophen] Itching    Social Hx:   History   Social History  . Marital Status: Divorced    Spouse Name: N/A    Number of Children: N/A  . Years of Education: N/A   Occupational History  . unemployed    Social History Main Topics  . Smoking status: Current Every Day Smoker -- 0.30 packs/day for 38 years    Types: Cigarettes  . Smokeless tobacco: Former Systems developer  . Alcohol Use: No  . Drug Use:  No  . Sexual Activity: Not on file   Other Topics Concern  . Not on file   Social History Narrative    She lives alone, she is divorced,   Ex husband is present at the visit. Reports 1.5 pack use/day.  Past Surgical Hx:  Past Surgical History  Procedure Laterality Date  . Lung lobectomy  2001    right upper lobe  . Thoracotomy      right side  . Forearm fracture surgery  ~ 1975    "right" (05/21/2012)  .  Peripherally inserted central catheter insertion    . Eye surgery  1979    corrected "Cross eyes"  . Video bronchoscopy  05/01/2012    Procedure: VIDEO BRONCHOSCOPY;  Surgeon: Grace Isaac, MD;  Location: [facility] OR;  Service: Thoracic;  Laterality: N/A;  Video Bronchoscopy  . Video bronchoscopy  05/23/2012    Procedure: VIDEO BRONCHOSCOPY;  Surgeon: Grace Isaac, MD;  Location: [place];  Service: Thoracic;  Laterality: N/A;  . Video assisted thoracoscopy  05/23/2012    Procedure: VIDEO ASSISTED THORACOSCOPY;  Surgeon: Grace Isaac, MD;  Location: [place];  Service: Thoracic;  Laterality: Right;  . Eloessor  05/23/2012    Procedure: Dorthula Matas PROCEDURE;  Surgeon: Grace Isaac, MD;  Location: [place];  Service: Thoracic;  Laterality: Right;  . Thoracotomy  05/23/2012    Procedure: THORACOTOMY MAJOR;  Surgeon: Grace Isaac, MD;  Location: [facility] OR;  Service: Thoracic;  Laterality: Right;  irrigation and debridment of right upper chest cavity    Past Medical Hx:  Past Medical History  Diagnosis Date  . Depression with anxiety   . COPD (chronic obstructive pulmonary disease)   . Empyema   . GERD (gastroesophageal reflux disease)   . Lung cancer     RULobectomy 2001, stage I adenocarcinoma T2, N0, Mx    . H/O pulmonary fibrosis   . Tobacco use disorder   . Vocal cord paralysis   . Abnormal weight loss   . Pneumonia   . Pleural effusion   . Thyroid nodule     tiny on right   . Hydropneumothorax   . Normocytic anemia   . Hemoptysis   . Shortness of breath     with exertion    Past Gynecological History:  G0 Reports a Pap test 10 years ago was abnormal but she was unable to followup No LMP recorded. Patient is postmenopausal. last normal menstrual period over 6 years ago   Family Hx: History reviewed. No pertinent family history.  Review of Systems:  Constitutional  Feels well, good appetite,  stable weight Cardiovascular  No chest pain, shortness of breath, or edema   Pulmonary  No cough or wheeze. Discharge from the chest wall/ lung since January 2014. Mucous-like copious Gastro Intestinal  No nausea, vomitting, reports intermittent diarrhea and constipation from narcotic use. No bright red blood per rectum, no abdominal pain,  Genito Urinary  No frequency, urgency, dysuria, reports vaginal discharge denies vaginal bleeding over the last month Musculo Skeletal  No myalgia, arthralgia, joint swelling or pain  Neurologic  No weakness, numbness, change in gait,  Psychology  No depression,   Vitals:  Blood pressure 129/66, pulse 92, temperature 98.8 F (37.1 C), temperature source Oral, resp. rate 16, height 5' 4.92" (1.649 m), weight 79 lb 4.8 oz (35.97 kg).  Physical Exam: WD in NAD cachectic, intense, impulsive, smells of tobacco  Neck  Supple NROM, without any enlargements.  Lymph  Node Survey No cervical supraclavicular or shotty left inguinal adenopathy Cardiovascular  Pulse normal rate, regularity and rhythm.  Lungs  Clear to auscultation bilateraly, 4 cm opening on the back with copious mucus discharge that is expelled with each breath.  The tissue almost appears to be like that of lung consistent with the known diagnosis of bronchocutaneous fistula. The area was redressed. Good air movement.within both  lung.   Skin  No rash/lesions/breakdown  Psychiatry  Alert and oriented , rapid pressured speech ,  Abdomen  Normoactive bowel sounds, abdomen soft, non-tender  Back No CVA tenderness Genito Urinary  Vulva/vagina: Normal external female genitalia.  No lesions. No discharge or bleeding.  Bladder/urethra:  No lesions or masses  Vagina: Ulcerative apex of the vagina.of the proximal 2 cm of the vagina  Cervix: Replaced by a 3 cm crater, extension of disease to the left parametria  Uterus: fixed, left parametrial extension  Rectal  Good tone, left parametrial extension of disease noted , Fixed  central mass  Extremities  No bilateral  cyanosis, clubbing or edema.   Janie Morning, MD, PhD

## 2013-07-05 ENCOUNTER — Telehealth: Payer: Self-pay | Admitting: *Deleted

## 2013-07-05 NOTE — Telephone Encounter (Signed)
Per MD, copy of Dr. Leone Brand office note faxed to Dr. Lysle Pearl [phone]

## 2013-07-08 ENCOUNTER — Telehealth: Payer: Self-pay | Admitting: *Deleted

## 2013-07-08 NOTE — Telephone Encounter (Signed)
Called pt home number to give appointment that was arranged for her with Dr. Cruzita Lederer @ [facility]. Pt was home but preferred the information was given to ex-husband [employer]. The appointment date of  Feb 26,2015 @ 3:40 was given to Mr. Mamie Nick along with the office number to Dr. Cruzita Lederer. Mr. Mamie Nick stated he was writing all details down and Ms. Provencher would attend her appointment.

## 2013-08-02 NOTE — Telephone Encounter (Signed)
erro  neous encounter

## 2013-09-18 ENCOUNTER — Telehealth: Payer: Self-pay | Admitting: Gynecologic Oncology

## 2013-09-18 NOTE — Telephone Encounter (Signed)
Consult Note: Gyn-Onc   Katrisha Segall 58 y.o. female  CC: Cervical cancer  Assessment/Plan:  Ms. Kaela Beitz  is a 58 y.o.  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 Jobe Igo.. Treatment has been delayed because she desires treatment at [facility]. At this visit the overall assessment is that there is tumor replacing the cervix and upper vagina and represents progression since her initial presentation..  A fixed  central mass is appreciated on imaging and examination.  It is unclear whether this represents tumor invasion of the uterus or extension outside the uterus.  There is no evidence of pelvic lymph node metastases however there is mild hypermetabolic activity in a left periaortic lymph node.  Recommendations at this patient received chemoradiation. Strong consideration could be given to including the left periaortic lymph nodes, however given the absence of pelvic lymph nodes it's possible that this may not represent metastatic disease. The patient is advised to discontinue or reduce her tobacco use.  Right broncho-cutaneous fistula The area appeared clean and a dressing was replaced.   HPI:  Pap 04/04/2013 high-grade squamous intraepithelial lesion Cervical biopsy 05/13/2013 squamous cell carcinoma with epithelial invasion.  She was seen at the [facility] by  Dr. Randalyn Rhea. At that visit physical examination is notable for 4.5 cm cervix with evidence of cancer extending to the vaginal sidewall and involving approximately 1 cm the upper vagina and approximately one centimeters left sided parametrial involvement. She was referred to radiation oncology. PET/CT  on 06/17/2013 and is notable for soft tissue mass in the presacral space measuring 4.9 x 5.6 cm it exerts a leftward mass effect on the rectosigmoid colon is contiguous with the uterus as possible metastatic left periaortic lymph node( mild  hypermetabolic activity in that area) pelvic adenopathy was appreciated  Past medical history is noted for COPD status post left upper lobectomy 15 years ago for lung cancer.   Current Meds:  Outpatient Encounter Prescriptions as of 09/18/2013  Medication Sig  . albuterol (PROVENTIL HFA;VENTOLIN HFA) 108 (90 BASE) MCG/ACT inhaler Inhale 2 puffs into the lungs every 6 (six) hours as needed for wheezing.  Marland Kitchen ALPRAZolam (XANAX) 1 MG tablet Take 1.5 mg by mouth 3 (three) times daily.   Marland Kitchen omeprazole (PRILOSEC) 20 MG capsule Take 20 mg by mouth daily.  Marland Kitchen oxyCODONE (OXYCONTIN) 10 MG 12 hr tablet Take 10 mg by mouth every 12 (twelve) hours.    Allergy:  Allergies  Allergen Reactions  . Codeine Itching and Other (See Comments)    Severe anxiety  . Other Swelling and Other (See Comments)    Steroids  "feels like choking"  . Spiriva [Tiotropium Bromide Monohydrate] Other (See Comments)    Patient does not like and states it doesn't work for her  . Vicodin [Hydrocodone-Acetaminophen] Itching    Social Hx:   History   Social History  . Marital Status: Divorced    Spouse Name: N/A    Number of Children: N/A  . Years of Education: N/A   Occupational History  . unemployed    Social History Main Topics  . Smoking status: Current Every Day Smoker -- 0.30 packs/day for 38 years    Types: Cigarettes  . Smokeless tobacco: Former Systems developer  . Alcohol Use: No  . Drug Use: No  . Sexual Activity: Not on file   Other Topics Concern  . Not on file   Social History Narrative  She lives alone, she is divorced,   Ex husband is present at the visit. Reports 1.5 pack use/day.  Past Surgical Hx:  Past Surgical History  Procedure Laterality Date  . Lung lobectomy  2001    right upper lobe  . Thoracotomy      right side  . Forearm fracture surgery  ~ 1975    "right" (05/21/2012)  . Peripherally inserted central catheter insertion    . Eye surgery  1979    corrected "Cross eyes"  . Video  bronchoscopy  05/01/2012    Procedure: VIDEO BRONCHOSCOPY;  Surgeon: Grace Isaac, MD;  Location[facility] OR;  Service: Thoracic;  Laterality: N/A;  Video Bronchoscopy  . Video bronchoscopy  05/23/2012    Procedure: VIDEO BRONCHOSCOPY;  Surgeon: Grace Isaac, MD;  Location: [place];  Service: Thoracic;  Laterality: N/A;  . Video assisted thoracoscopy  05/23/2012    Procedure: VIDEO ASSISTED THORACOSCOPY;  Surgeon: Grace Isaac, MD;  Location: [place];  Service: Thoracic;  Laterality: Right;  . Eloessor  05/23/2012    Procedure: Dorthula Matas PROCEDURE;  Surgeon: Grace Isaac, MD;  Location: [place];  Service: Thoracic;  Laterality: Right;  . Thoracotomy  05/23/2012    Procedure: THORACOTOMY MAJOR;  Surgeon: Grace Isaac, MD;  Location: [facility] OR;  Service: Thoracic;  Laterality: Right;  irrigation and debridment of right upper chest cavity    Past Medical Hx:  Past Medical History  Diagnosis Date  . Depression with anxiety   . COPD (chronic obstructive pulmonary disease)   . Empyema   . GERD (gastroesophageal reflux disease)   . Lung cancer     RULobectomy 2001, stage I adenocarcinoma T2, N0, Mx    . H/O pulmonary fibrosis   . Tobacco use disorder   . Vocal cord paralysis   . Abnormal weight loss   . Pneumonia   . Pleural effusion   . Thyroid nodule     tiny on right   . Hydropneumothorax   . Normocytic anemia   . Hemoptysis   . Shortness of breath     with exertion    Past Gynecological History:  G0 Reports a Pap test 10 years ago was abnormal but she was unable to followup No LMP recorded. Patient is postmenopausal. last normal menstrual period over 6 years ago   Family Hx: No family history on file.  Review of Systems:  Constitutional  Feels well, good appetite,  stable weight Cardiovascular  No chest pain, shortness of breath, or edema  Pulmonary  No cough or wheeze. Discharge from the chest wall/ lung since January 2014. Mucous-like copious Gastro Intestinal  No  nausea, vomitting, reports intermittent diarrhea and constipation from narcotic use. No bright red blood per rectum, no abdominal pain,  Genito Urinary  No frequency, urgency, dysuria, reports vaginal discharge denies vaginal bleeding over the last month Musculo Skeletal  No myalgia, arthralgia, joint swelling or pain  Neurologic  No weakness, numbness, change in gait,  Psychology  No depression,   Vitals:  Blood pressure 129/66, pulse 92, temperature 98.8 F (37.1 C), temperature source Oral, resp. rate 16, height 5' 4.92" (1.649 m), weight 79 lb 4.8 oz (35.97 kg).  Physical Exam: WD in NAD cachectic, intense, impulsive, smells of tobacco  Neck  Supple NROM, without any enlargements.  Lymph Node Survey No cervical supraclavicular or shotty left inguinal adenopathy Cardiovascular  Pulse normal rate, regularity and rhythm.  Lungs  Clear to auscultation bilateraly, 4 cm  opening on the back with copious mucus discharge that is expelled with each breath.  The tissue almost appears to be like that of lung consistent with the known diagnosis of bronchocutaneous fistula. The area was redressed. Good air movement.within both  lung.   Skin  No rash/lesions/breakdown  Psychiatry  Alert and oriented , rapid pressured speech ,  Abdomen  Normoactive bowel sounds, abdomen soft, non-tender  Back No CVA tenderness Genito Urinary  Vulva/vagina: Normal external female genitalia.  No lesions. No discharge or bleeding.  Bladder/urethra:  No lesions or masses  Vagina: Ulcerative apex of the vagina.of the proximal 2 cm of the vagina  Cervix: Replaced by a 3 cm crater, extension of disease to the left parametria  Uterus: fixed, left parametrial extension  Rectal  Good tone, left parametrial extension of disease noted , Fixed  central mass  Extremities  No bilateral cyanosis, clubbing or edema.   Janie Morning, MD, PhD

## 2013-09-19 ENCOUNTER — Ambulatory Visit: Payer: Medicaid Other | Admitting: Gynecologic Oncology

## 2014-05-02 IMAGING — CR DG CHEST 1V PORT
1 series · 1 of 1 positions shown · non-contrast
Comparison: 05/01/2012

CLINICAL DATA: Follow-up chronic lung abscess

PORTABLE CHEST - 1 VIEW

[AP]
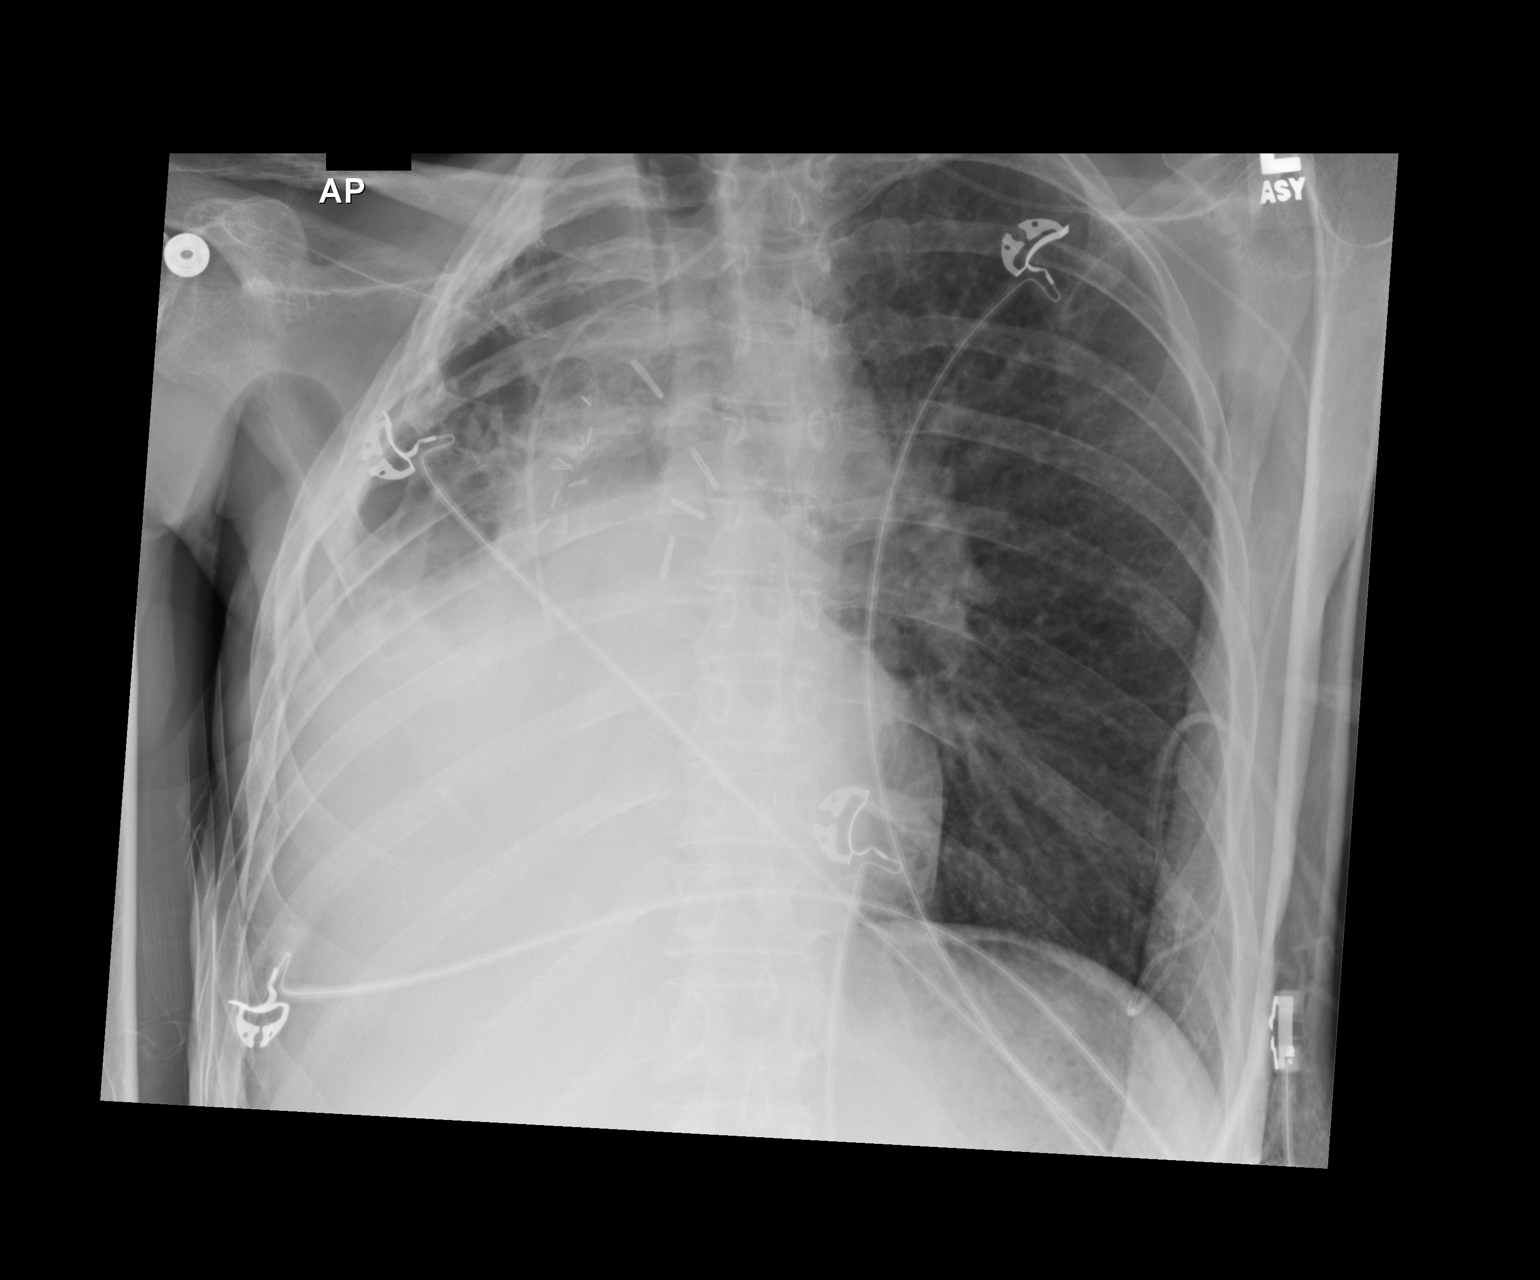

[1 of 1 positions shown; findings below may reference images not displayed]

FINDINGS: The right mid and lower hemithorax remains opacified with
cystic area is noted in the aerated portions of the right upper
hemithorax and lung.  There are multiple surgical clips
superimposed over the right hilum and carina.  Air-fluid levels
seen in cavities on the prior study are less prominent but there is
otherwise been no change.

There are thickened bronchovascular markings throughout the left
lung which is also hyperexpanded.  No left lung consolidation.  No
left pleural effusion and no pneumothorax.

Left PICC is stable well-positioned.
IMPRESSION: No significant change from the prior study.  Extensive opacity in
the right hemithorax likely reflects a combination of lung
consolidation and pleural effusion.  There are multiple areas of
cavitation in the aerated portions of the right upper hemithorax.
These findings are stable.  No new abnormalities.

## 2014-05-17 IMAGING — CR DG CHEST 2V
2 series · 2 of 2 positions shown · non-contrast
Comparison: Chest radiographs dated 05/02/2012.  PET CT dated
04/19/2012.

CLINICAL DATA: Shortness of breath, history of right lung cancer

CHEST - 2 VIEW

[w chest pa]
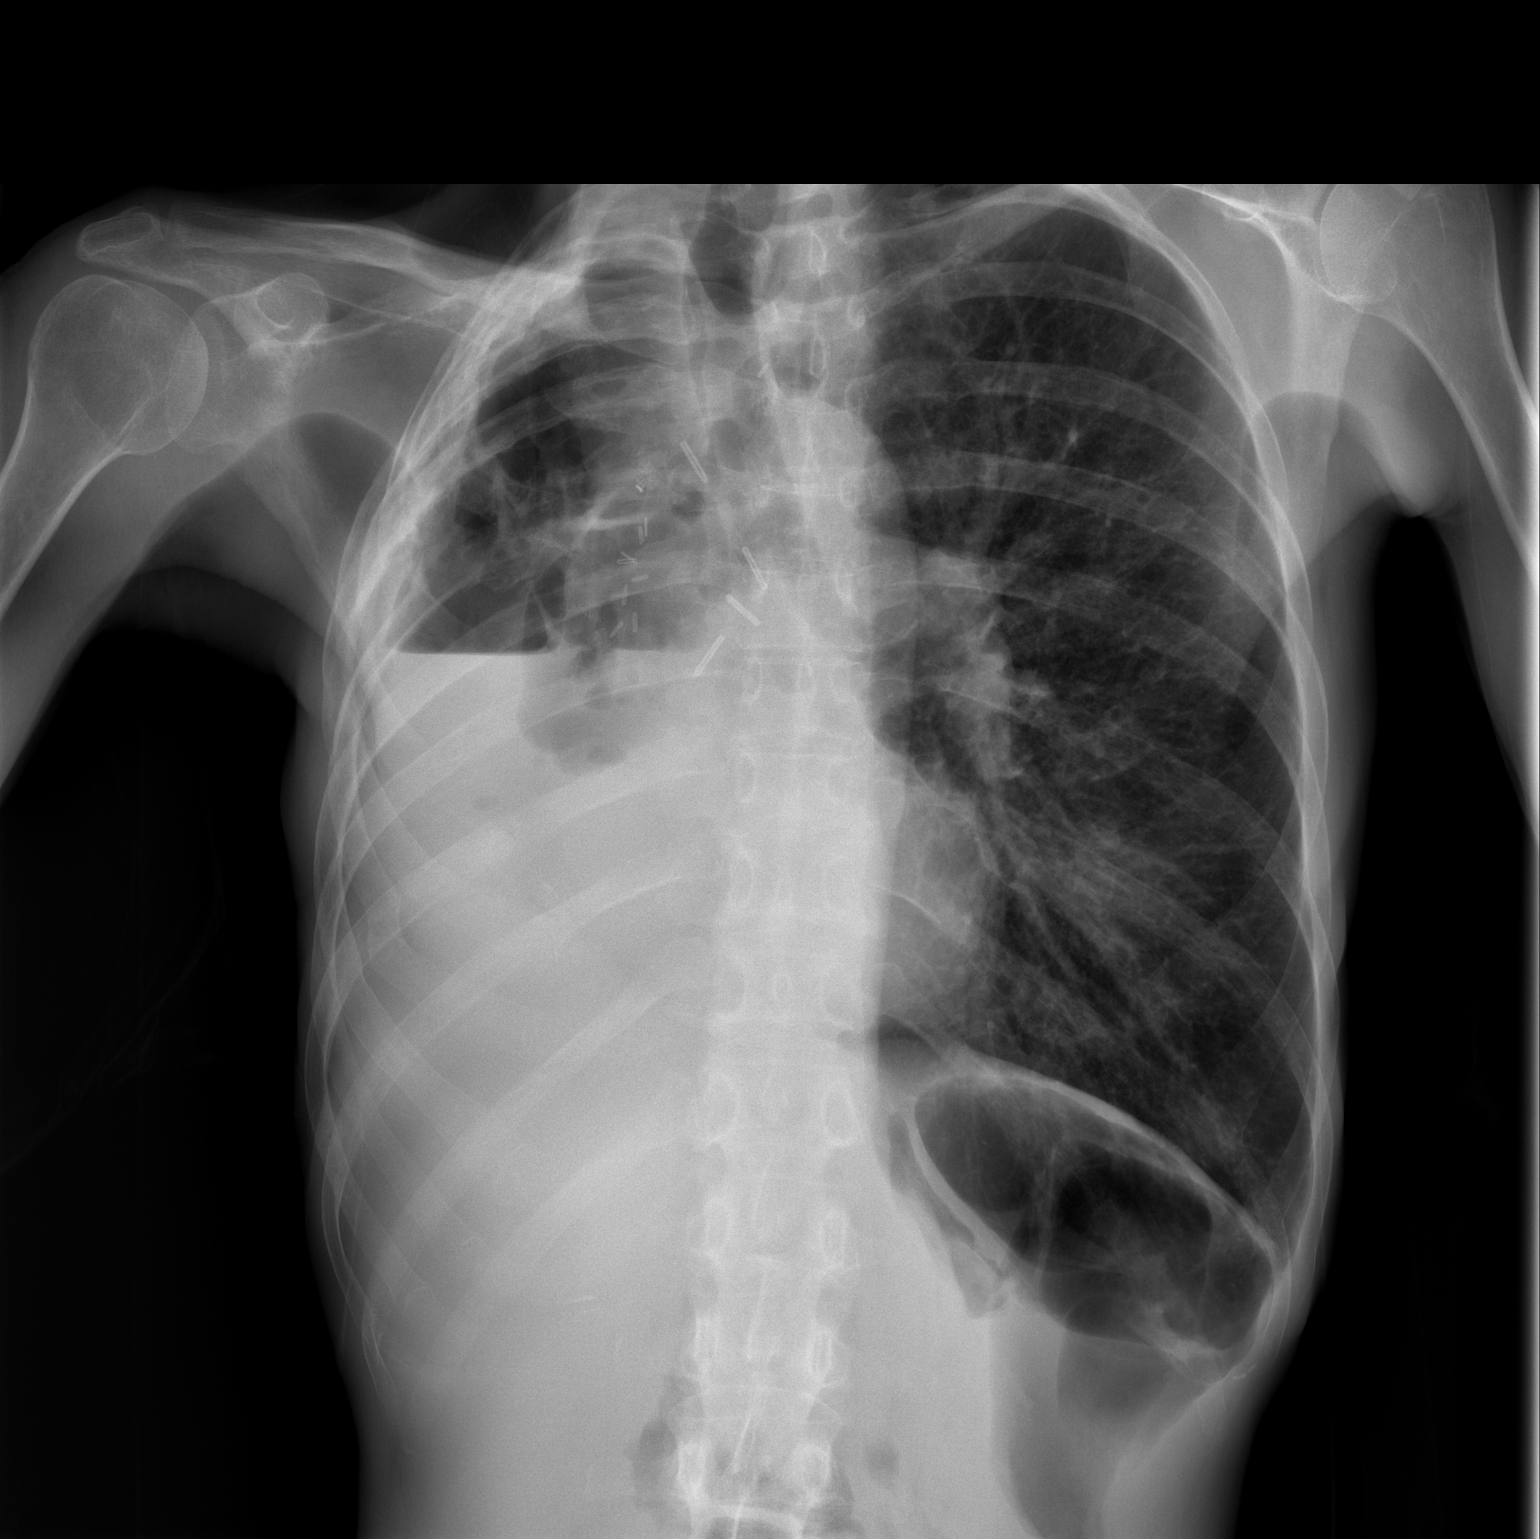

[w chest lat]
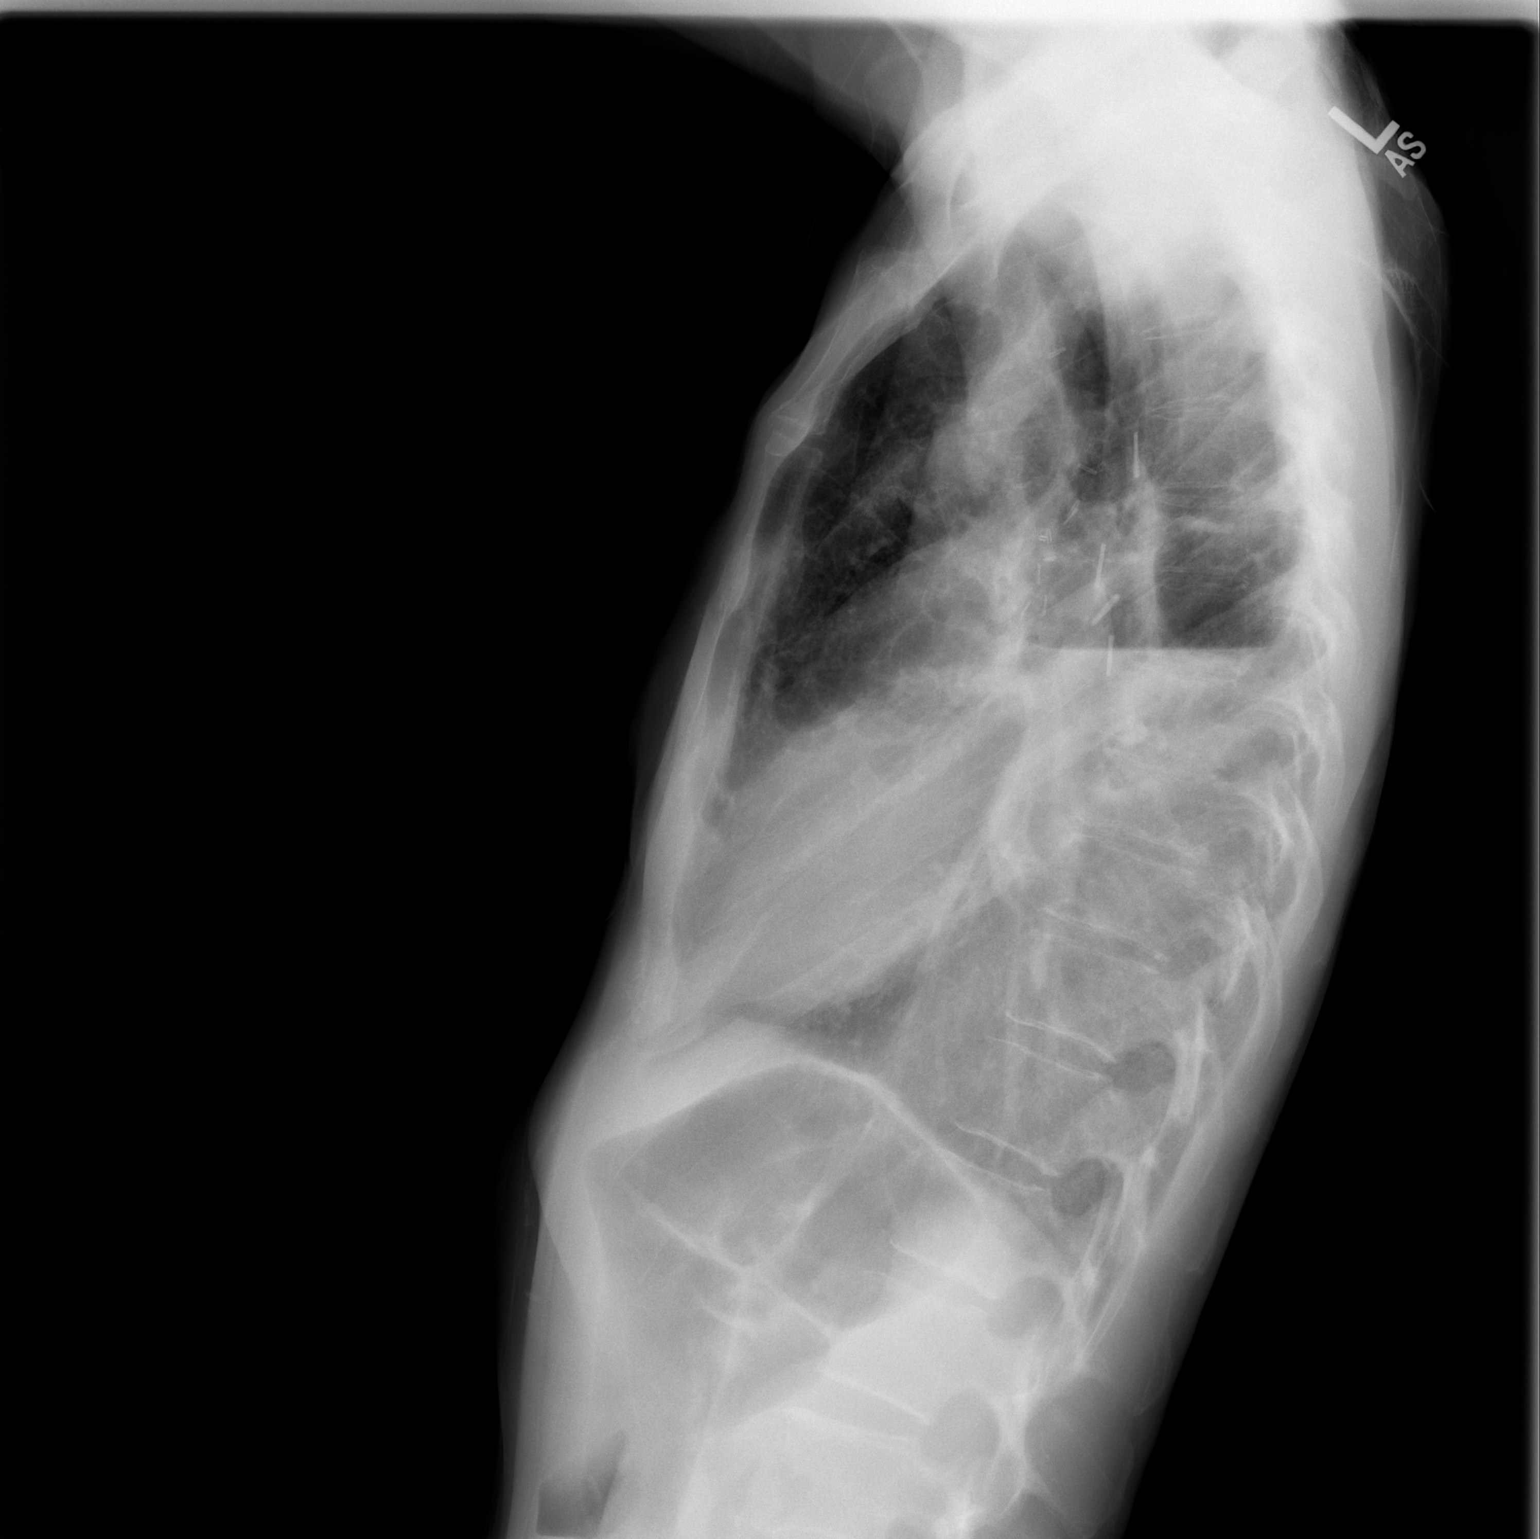

[2 of 2 positions shown; findings below may reference images not displayed]

FINDINGS: Postsurgical changes in the right hemithorax.  Air-fluid
level in the right mid lung, corresponding to a hydropneumothorax
on prior PET CT, grossly unchanged.

Left lung is notable for chronic interstitial
markings/emphysematous changes.

No mediastinal silhouette is mildly shifted to the right.

Visualized osseous structures are within normal limits.
IMPRESSION: Postsurgical changes in the right hemithorax.

Associated right hydropneumothorax, grossly unchanged.

## 2014-05-21 IMAGING — CT CT CHEST W/ CM
2 of 4 series · 15 of 36 positions shown, 18 images · IV contrast (APPLIED)
Comparison: CT on 06/24/2011

CLINICAL DATA: Follow-up lung carcinoma.  Cough.  Short of breath.

CT CHEST WITH CONTRAST
TECHNIQUE: Multidetector CT imaging of the chest was performed
following the standard protocol during bolus administration of
intravenous contrast.
Contrast: 80mL OMNIPAQUE IOHEXOL 300 MG/ML  SOLN

[Series 2: routine chest 5.0 st · axial · 0.66mm/px · z∈[-306,-31]mm · 12 of 66 slices shown, 15 images]
[im 6/66  mediastinal]
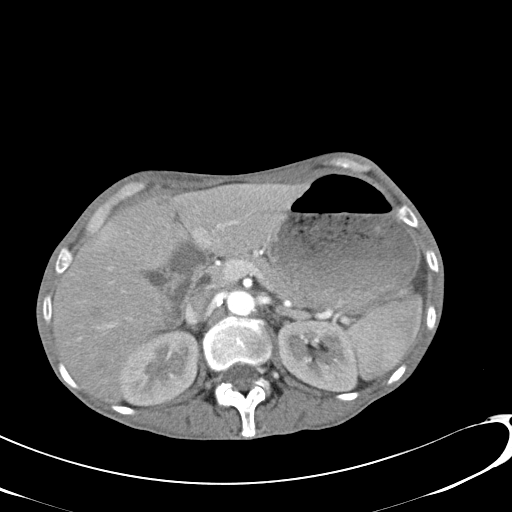
[im 6/66  lung]
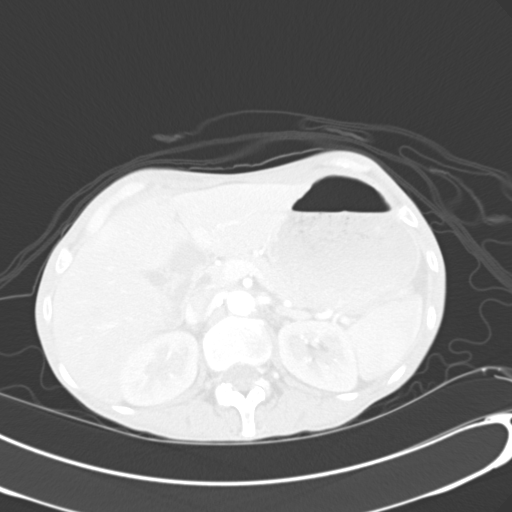
[im 11/66  lung]
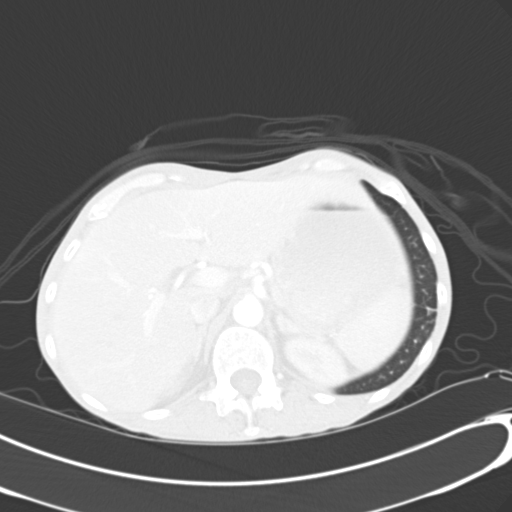
[im 16/66  lung]
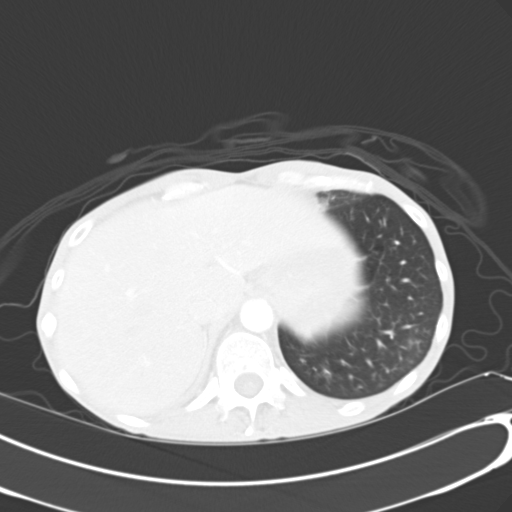
[im 21/66  lung]
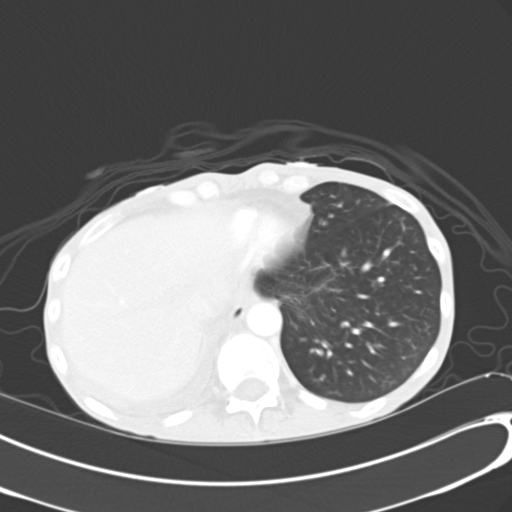
[im 26/66  mediastinal]
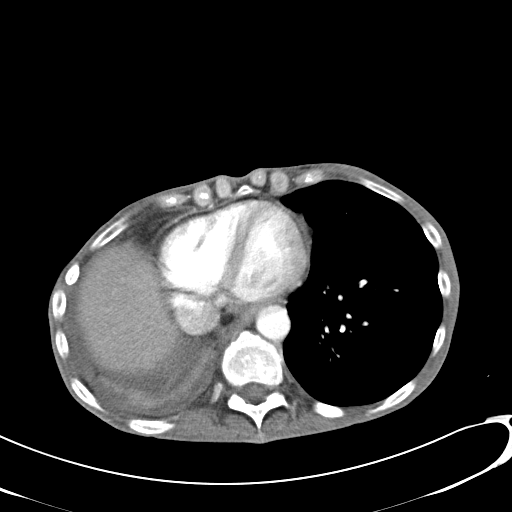
[im 26/66  lung]
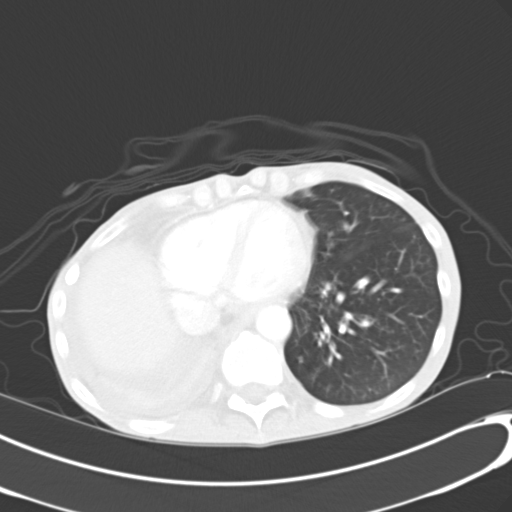
[im 31/66  lung]
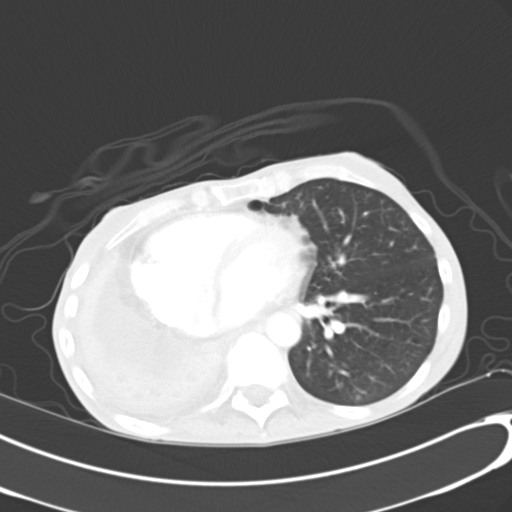
[im 36/66  lung]
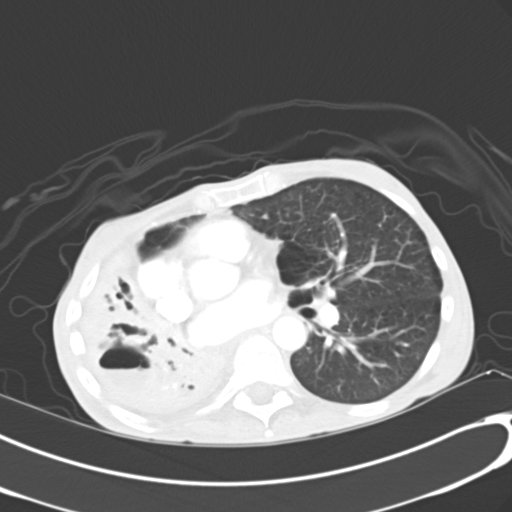
[im 41/66  lung]
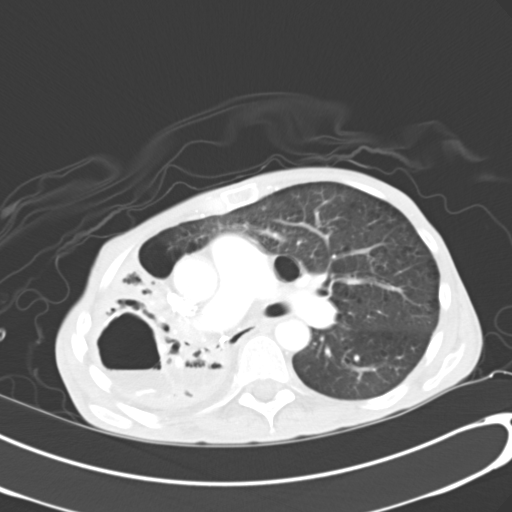
[im 46/66  mediastinal]
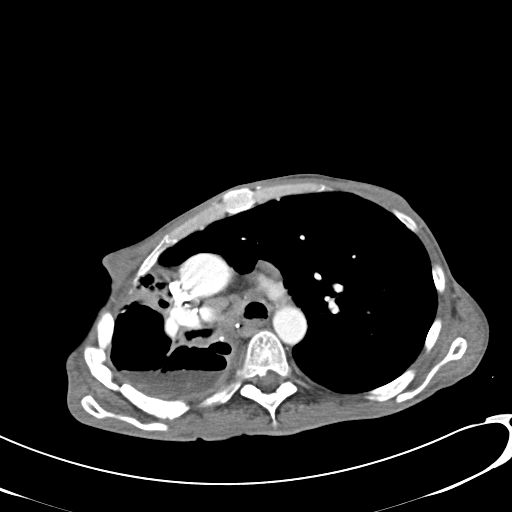
[im 46/66  lung]
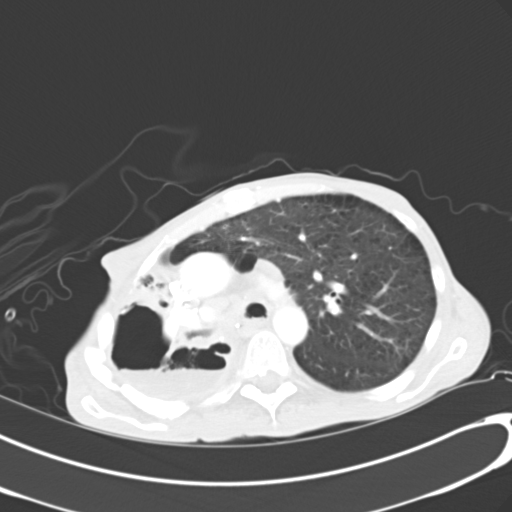
[im 51/66  lung]
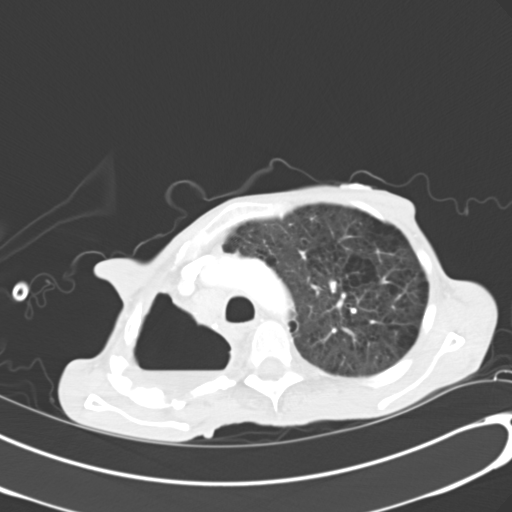
[im 56/66  lung]
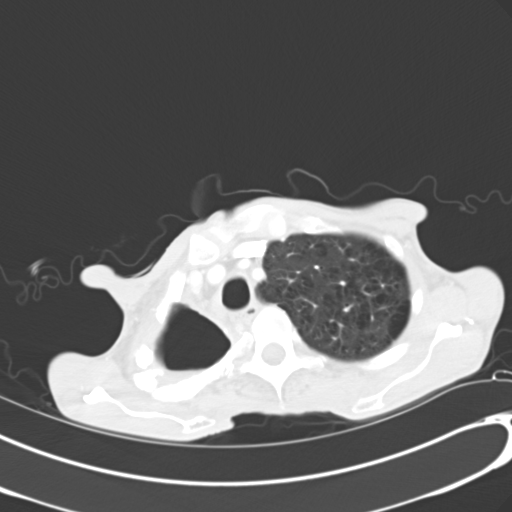
[im 61/66  lung]
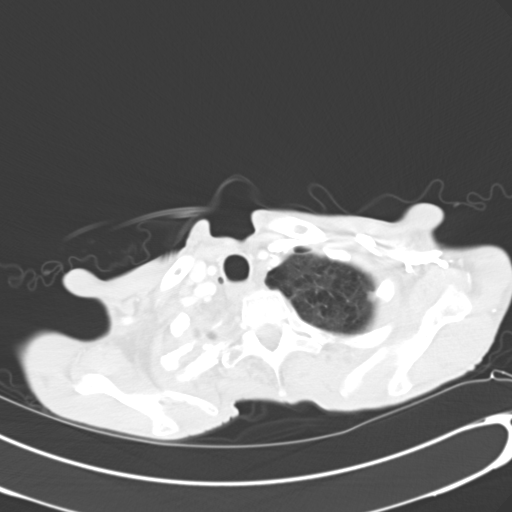

[Series 5: coronals · coronal · 0.86mm/px · 3 of 90 slices shown]
[im 18/90  lung]
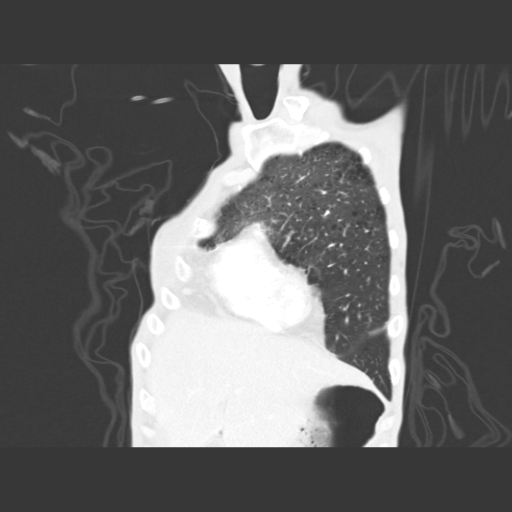
[im 36/90  lung]
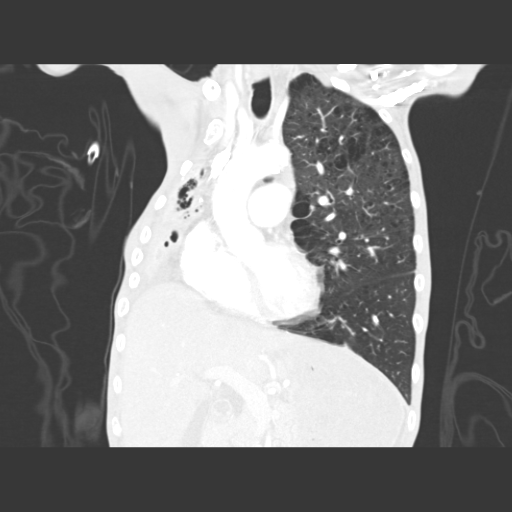
[im 54/90  lung]
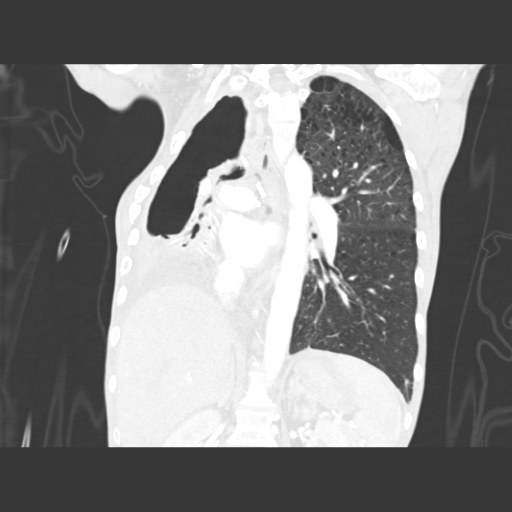

[15 of 36 positions shown; findings below may reference images not displayed]

FINDINGS: Postop changes from previous right thoracotomy are stable
in appearance.  A large cavitary lesion is again seen in the right
upper hemithorax containing air fluid level. This shows interval
enlargement, and could represent a hydropneumothorax secondary to
bronchopleural fistula although an intraparenchymal component
cannot be excluded.  There is a worsening diffuse collapse of the
right lower lung with central air bronchograms and bronchiectasis.

Moderate left lung emphysema and compensatory hyperinflation is
demonstrated.  No evidence of left lung and new 8 mm nodular
density is seen in the superior and medial left lower lobe on image
28.  No evidence of left pleural effusion.

Mediastinal lymphadenopathy and right paratracheal region is mildly
decreased in size, now measuring 2.3 x 2.6 cm compared to 2.3 x
cm. No new or increased lymphadenopathy seen within the thorax.  No
evidence of suspicious bone lesions of chest wall mass.
IMPRESSION: 1.  Increased size of the large cavitary lesion with air-fluid
level in the superior right hemithorax. This could represent a
hydropneumothorax secondary bronchopleural fistula, although
intraparenchymal component cannot be excluded.
2.  Interval diffuse right lower lung collapse, with central air
bronchograms and bronchiectasis.
3.  Left lung emphysema and indeterminate 8 mm nodule in the medial
left lower lobe. Continued follow-up by CT is recommended.
4.  Slight decrease in right paratracheal mediastinal
lymphadenopathy.

## 2014-08-15 DEATH — deceased

## 2020-08-27 ENCOUNTER — Other Ambulatory Visit (HOSPITAL_COMMUNITY): Payer: Self-pay
